# Patient Record
Sex: Male | Born: 1965 | Race: Black or African American | Hispanic: No | Marital: Married | Smoking: Never smoker
Health system: Southern US, Community
[De-identification: ages and names within clinical notes are randomized; demographics above are authoritative.]

## PROBLEM LIST (undated history)

## (undated) DIAGNOSIS — Z992 Dependence on renal dialysis: Secondary | ICD-10-CM

## (undated) DIAGNOSIS — I10 Essential (primary) hypertension: Secondary | ICD-10-CM

## (undated) HISTORY — PX: HERNIA REPAIR: SHX51

## (undated) HISTORY — PX: AV FISTULA PLACEMENT: SHX1204

## (undated) HISTORY — PX: KIDNEY TRANSPLANT: SHX239

---

## 2010-12-20 ENCOUNTER — Ambulatory Visit
Admission: RE | Admit: 2010-12-20 | Discharge: 2010-12-20 | Disposition: A | Discharge status: Home / Self Care | Payer: Medicare Other | Source: Ambulatory Visit | Attending: Geriatric Medicine | Admitting: Geriatric Medicine

## 2010-12-20 ENCOUNTER — Other Ambulatory Visit: Payer: Self-pay | Admitting: Geriatric Medicine

## 2010-12-20 DIAGNOSIS — M79673 Pain in unspecified foot: Secondary | ICD-10-CM

## 2012-08-29 IMAGING — CR DG FOOT COMPLETE 3+V*R*
3 series · 3 of 3 positions shown · non-contrast
Comparison: None.

CLINICAL DATA: Right foot pain.

RIGHT FOOT COMPLETE - 3+ VIEW

[view not recorded (1 of 3)]
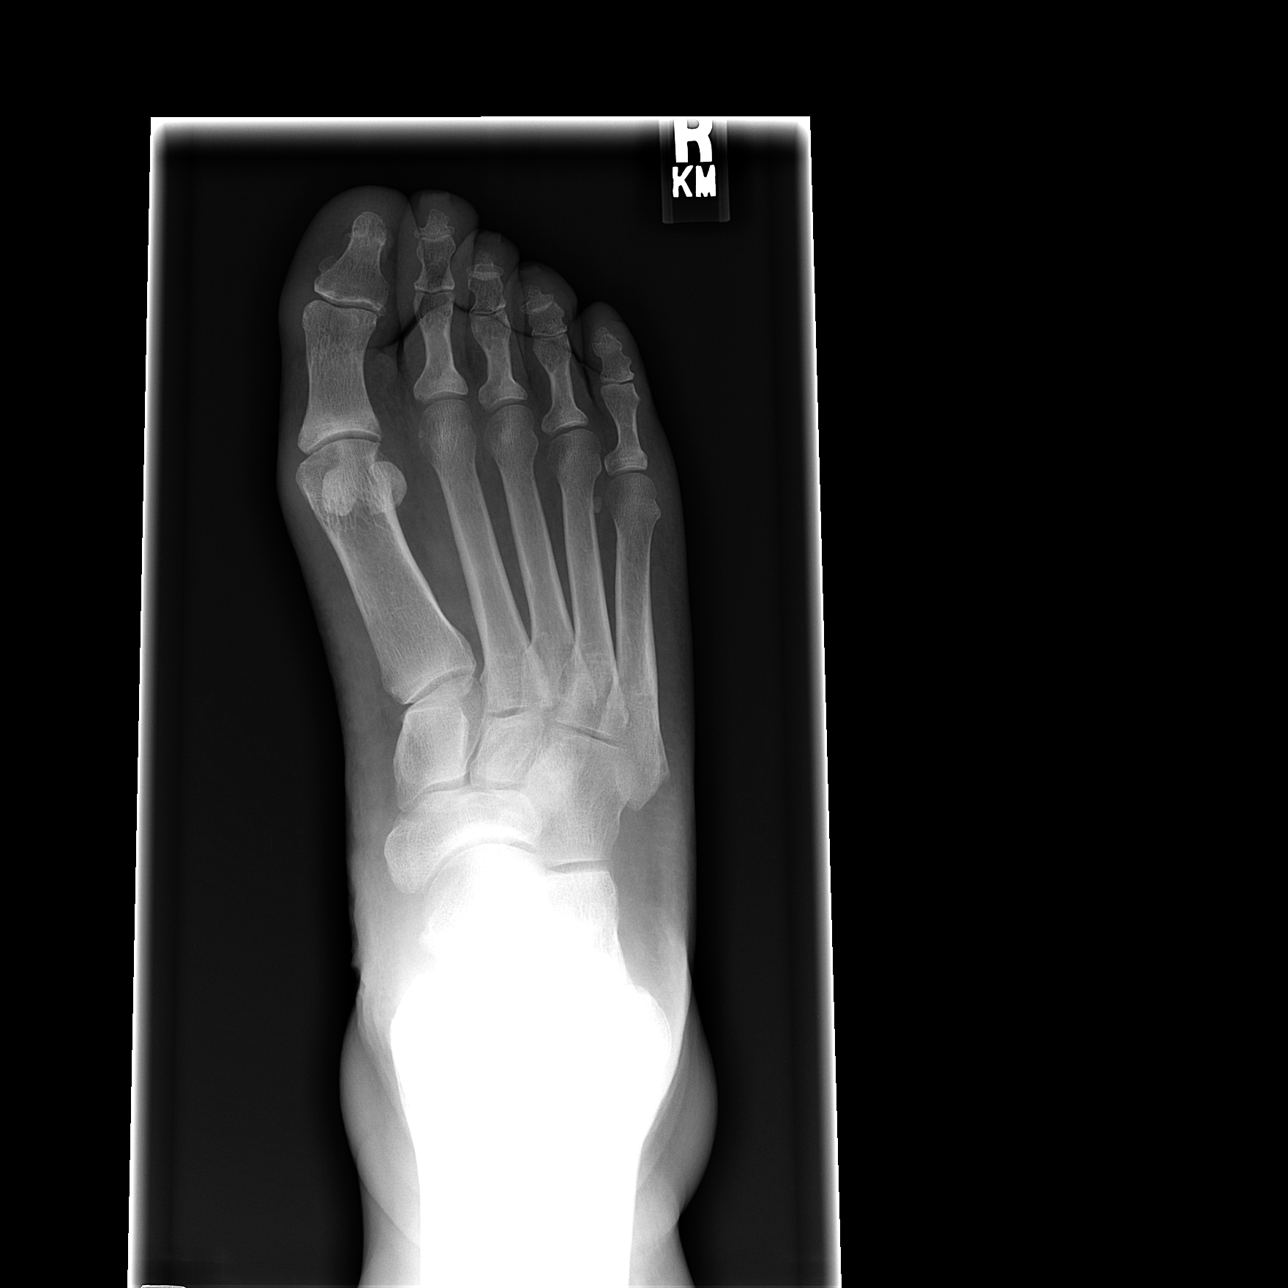

[view not recorded (2 of 3)]
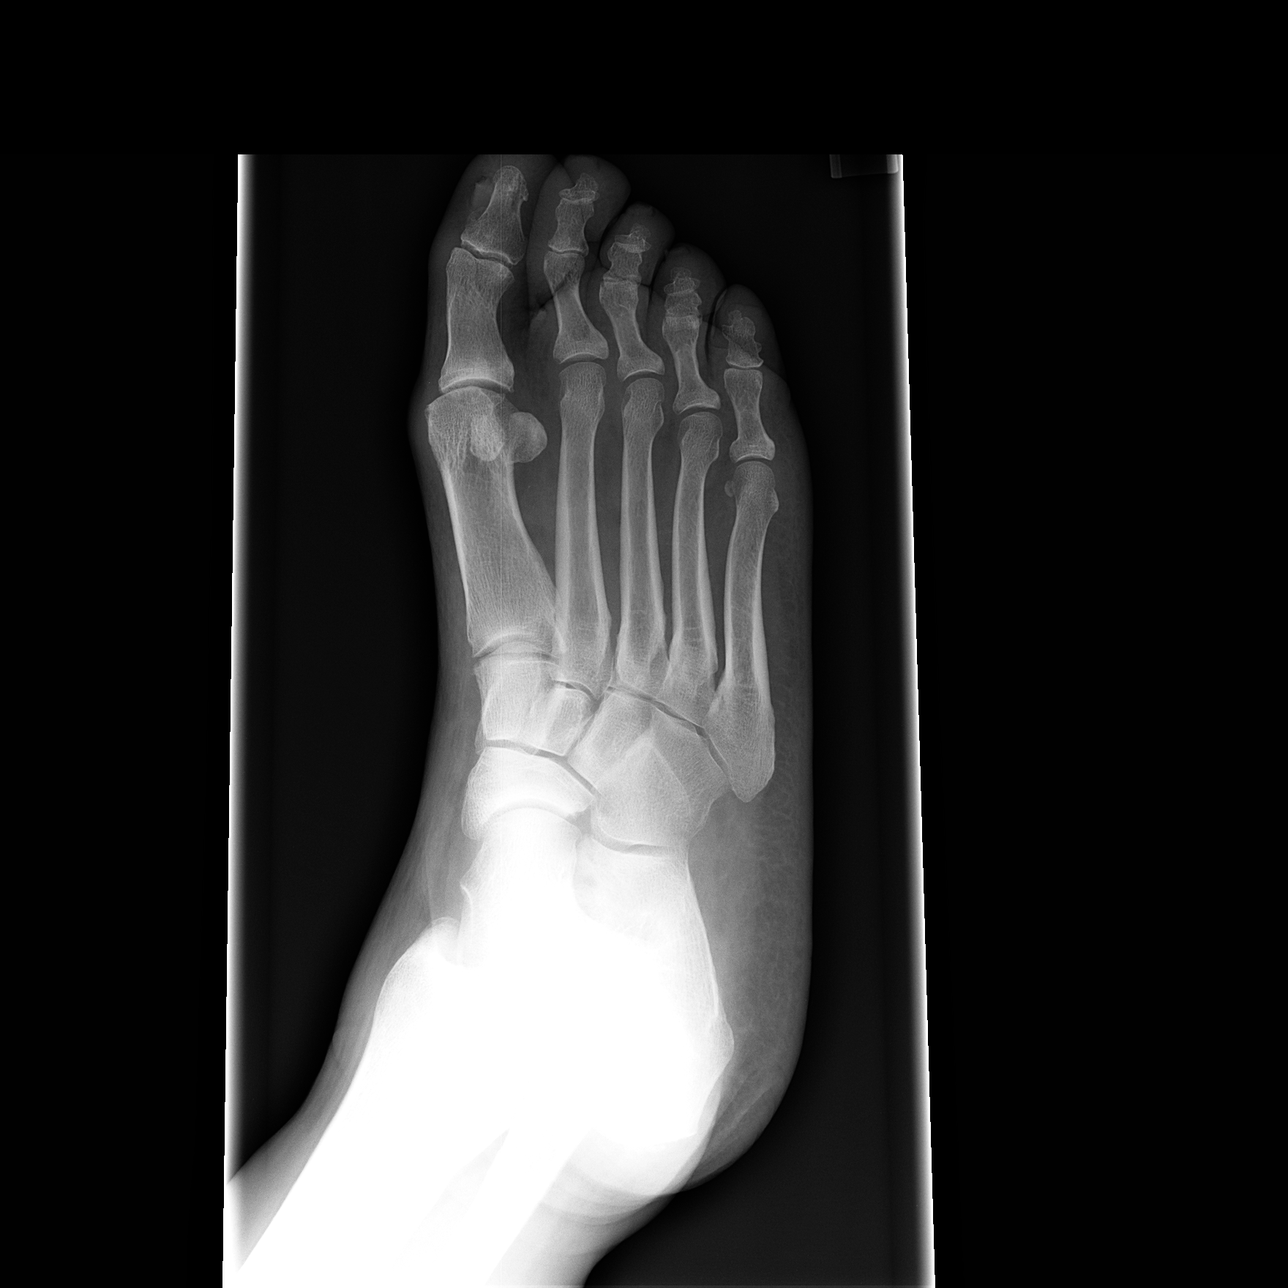

[view not recorded (3 of 3)]
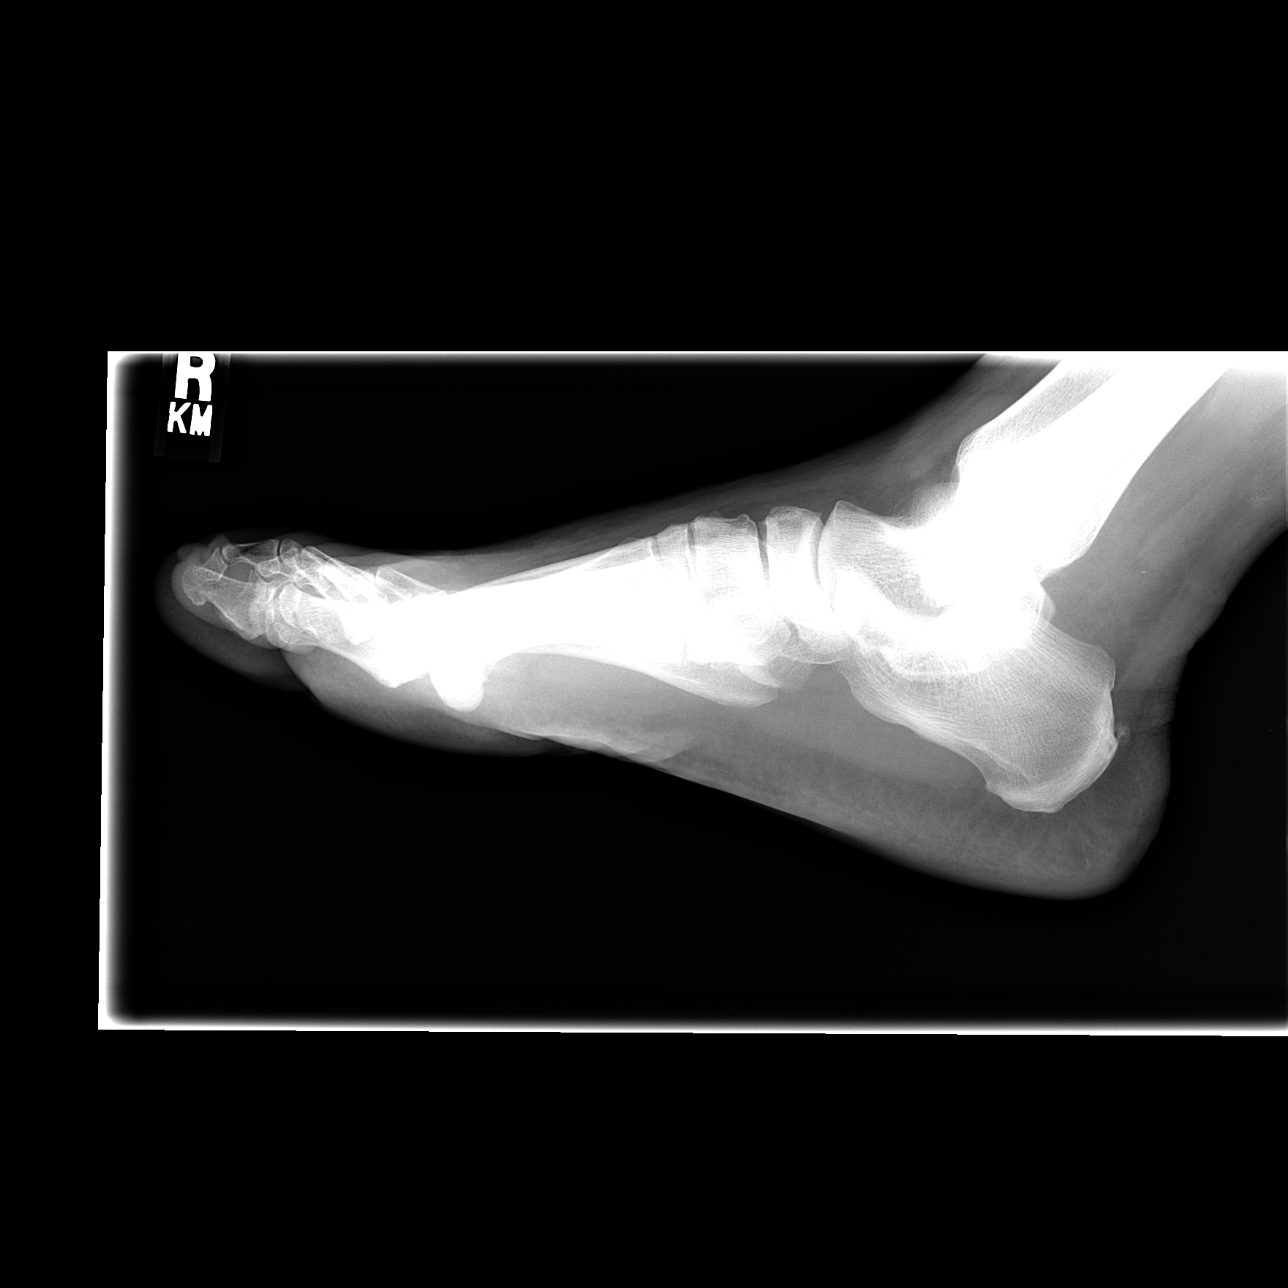

[3 of 3 positions shown; findings below may reference images not displayed]

FINDINGS: The joint spaces are fairly well maintained.  There are
mild degenerative changes at the first metatarsal phalangeal joint
and interphalangeal joint.  Mild hallux valgus deformity.  No acute
bony findings.  Minimal spurring change near the Achilles
attachment.
IMPRESSION: 1.  Mild degenerative changes involving the metatarsal phalangeal
joint and interphalangeal joint of the great toe.
2.  No acute bony findings.

## 2015-03-19 ENCOUNTER — Emergency Department (HOSPITAL_BASED_OUTPATIENT_CLINIC_OR_DEPARTMENT_OTHER)
Admission: EM | Admit: 2015-03-19 | Discharge: 2015-03-19 | Disposition: A | Discharge status: Home / Self Care | Payer: Medicare HMO | Attending: Emergency Medicine | Admitting: Emergency Medicine

## 2015-03-19 ENCOUNTER — Encounter (HOSPITAL_BASED_OUTPATIENT_CLINIC_OR_DEPARTMENT_OTHER): Payer: Self-pay | Admitting: *Deleted

## 2015-03-19 DIAGNOSIS — Z79899 Other long term (current) drug therapy: Secondary | ICD-10-CM | POA: Insufficient documentation

## 2015-03-19 DIAGNOSIS — N186 End stage renal disease: Secondary | ICD-10-CM | POA: Insufficient documentation

## 2015-03-19 DIAGNOSIS — Z992 Dependence on renal dialysis: Secondary | ICD-10-CM | POA: Diagnosis not present

## 2015-03-19 DIAGNOSIS — R319 Hematuria, unspecified: Secondary | ICD-10-CM | POA: Diagnosis present

## 2015-03-19 DIAGNOSIS — I120 Hypertensive chronic kidney disease with stage 5 chronic kidney disease or end stage renal disease: Secondary | ICD-10-CM | POA: Diagnosis not present

## 2015-03-19 DIAGNOSIS — N3091 Cystitis, unspecified with hematuria: Secondary | ICD-10-CM | POA: Insufficient documentation

## 2015-03-19 DIAGNOSIS — Z7901 Long term (current) use of anticoagulants: Secondary | ICD-10-CM | POA: Diagnosis not present

## 2015-03-19 DIAGNOSIS — N3001 Acute cystitis with hematuria: Secondary | ICD-10-CM

## 2015-03-19 HISTORY — DX: Dependence on renal dialysis: Z99.2

## 2015-03-19 HISTORY — DX: Essential (primary) hypertension: I10

## 2015-03-19 LAB — URINE MICROSCOPIC-ADD ON

## 2015-03-19 LAB — URINALYSIS, ROUTINE W REFLEX MICROSCOPIC
BILIRUBIN URINE: NEGATIVE
GLUCOSE, UA: NEGATIVE mg/dL
KETONES UR: NEGATIVE mg/dL
Nitrite: POSITIVE — AB
Specific Gravity, Urine: 1.015 (ref 1.005–1.030)
pH: 6.5 (ref 5.0–8.0)

## 2015-03-19 LAB — PROTIME-INR
INR: 2.29 — AB (ref 0.00–1.49)
PROTHROMBIN TIME: 25 s — AB (ref 11.6–15.2)

## 2015-03-19 MED ORDER — CEPHALEXIN 500 MG PO CAPS: 500.0000 mg | ORAL_CAPSULE | Freq: Two times a day (BID) | ORAL | Status: DC

## 2015-03-19 MED ORDER — CEPHALEXIN 250 MG PO CAPS
500.0000 mg | ORAL_CAPSULE | Freq: Once | ORAL | Status: AC
  Administered 2015-03-19: 500 mg via ORAL
  Filled 2015-03-19: qty 2

## 2015-03-19 NOTE — ED Notes (Signed)
Pt provided specimen cup for UA, states unable to provide spec at this time

## 2015-03-19 NOTE — ED Notes (Signed)
Pt reports hematuria since Friday- Pt states he had dialysis yesterday- denies pain

## 2015-03-19 NOTE — Discharge Instructions (Signed)
Please read and follow all provided instructions.  Your diagnoses today include:  1. Acute cystitis with hematuria    Tests performed today include:  Urine test - suggests that you have an infection in your bladder  Coumadin level - 2.5 today  Vital signs. See below for your results today.   Medications prescribed:   Keflex (cephalexin) - antibiotic  You have been prescribed an antibiotic medicine: take the entire course of medicine even if you are feeling better. Stopping early can cause the antibiotic not to work.  Home care instructions:  Follow any educational materials contained in this packet.  Follow-up instructions: Please follow-up with your nephrologist at dialysis in 2 days. Tell them that you're on medication for urinary tract infection.   Return instructions:   Please return to the Emergency Department if you experience worsening symptoms.   Return with fever, worsening pain, persistent vomiting, worsening pain in your back.   Please return if you have any other emergent concerns.  Additional Information:  Your vital signs today were: BP 112/70 mmHg   Pulse 78   Temp(Src) 98.1 F (36.7 C) (Oral)   Resp 18   Ht 5\' 11"  (1.803 m)   Wt 130.545 kg   BMI 40.16 kg/m2   SpO2 97% If your blood pressure (BP) was elevated above 135/85 this visit, please have this repeated by your doctor within one month. --------------

## 2015-03-19 NOTE — ED Provider Notes (Signed)
CSN: [id number]     Arrival date & time 03/19/15  1825 History   First MD Initiated Contact with Patient 03/19/15 2027     Chief Complaint  Patient presents with  . Hematuria     (Consider location/radiation/quality/duration/timing/severity/associated sxs/prior Treatment) HPI Comments: Patient with history of end-stage renal disease on dialysis Tuesday/Thursday/Saturday presents with two-day history of hematuria. 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 Patient's INR was checked several days ago and was in the 7 range. Patient was instructed to decrease dose of Coumadin. He has not had it rechecked since that time. Denies other areas of bleeding or bruising. Patient has had some increased urgency but no dysuria. He urinates approximately twice a day. No fever or back pain. No nausea or vomiting. Onset of symptoms acute. Course is constant. Nothing makes symptoms better or worse. He does have a remote history of UTI.  The history is provided by the patient.    Past Medical History  Diagnosis Date  . Dialysis patient (HCC)   . Hypertension    Past Surgical History  Procedure Laterality Date  . Av fistula placement    . Hernia repair     No family history on file. Social History  Substance Use Topics  . Smoking status: Never Smoker   . Smokeless tobacco: Never Used  . Alcohol Use: No     Comment: former    Review of Systems  Constitutional: Negative for fever.  HENT: Negative for rhinorrhea and sore throat.   Eyes: Negative for redness.  Respiratory: Negative for cough.   Cardiovascular: Negative for chest pain.  Gastrointestinal: Negative for nausea, vomiting, abdominal pain and diarrhea.  Genitourinary: Positive for urgency, frequency and hematuria. Negative for dysuria.  Musculoskeletal: Negative for myalgias.  Skin: Negative for rash.  Neurological: Negative for headaches.      Allergies  Tetracyclines & related  Home Medications   Prior  to Admission medications   Medication Sig Start Date End Date Taking? Authorizing Provider  amLODipine (NORVASC) 10 MG tablet Take 10 mg by mouth daily.   Yes Historical Provider, MD  Calcium Acetate, Phos Binder, (CALCIUM ACETATE PO) Take 3 tablets by mouth 3 (three) times daily with meals. 2 tabs with snacks   Yes Historical Provider, MD  carvedilol (COREG) 25 MG tablet Take 25 mg by mouth daily.   Yes Historical Provider, MD  cinacalcet (SENSIPAR) 60 MG tablet Take 60 mg by mouth daily.   Yes Historical Provider, MD  furosemide (LASIX) 40 MG tablet Take 40 mg by mouth daily.   Yes Historical Provider, MD  isosorbide mononitrate (IMDUR) 60 MG 24 hr tablet Take 60 mg by mouth daily.   Yes Historical Provider, MD  multivitamin (RENA-VIT) TABS tablet Take 1 tablet by mouth daily.   Yes Historical Provider, MD  oxycodone (OXY-IR) 5 MG capsule Take 5 mg by mouth every 4 (four) hours as needed.   Yes Historical Provider, MD  sevelamer carbonate (RENVELA) 800 MG tablet Take 800 mg by mouth 3 (three) times daily with meals. 3 tabs with meals, 2 tabs with snacks   Yes Historical Provider, MD  warfarin (COUMADIN) 7.5 MG tablet Take 7.5 mg by mouth daily.   Yes Historical Provider, MD   BP 131/72 mmHg  Pulse 86  Temp(Src) 98.1 F (36.7 C) (Oral)  Resp 18  Ht 5\' 11"  (1.803 m)  Wt 130.545 kg  BMI 40.16 kg/m2  SpO2 99% Physical Exam  Constitutional: He appears well-developed  and well-nourished.  HENT:  Head: Normocephalic and atraumatic.  Eyes: Conjunctivae are normal. Right eye exhibits no discharge. Left eye exhibits no discharge.  Neck: Normal range of motion. Neck supple.  Cardiovascular: Normal rate, regular rhythm and normal heart sounds.   Pulmonary/Chest: Effort normal and breath sounds normal.  Abdominal: Soft. There is no tenderness.  Neurological: He is alert.  Skin: Skin is warm and dry.  Psychiatric: He has a normal mood and affect.  Nursing note and vitals reviewed.   ED Course   Procedures (including critical care time) Labs Review Labs Reviewed  URINALYSIS, ROUTINE W REFLEX MICROSCOPIC (NOT AT [facility]) - Abnormal; Notable for the following:    Color, Urine AMBER (*)    APPearance CLOUDY (*)    Hgb urine dipstick LARGE (*)    Protein, ur >300 (*)    Nitrite POSITIVE (*)    Leukocytes, UA MODERATE (*)    All other components within normal limits  PROTIME-INR - Abnormal; Notable for the following:    Prothrombin Time 25.0 (*)    INR 2.29 (*)    All other components within normal limits  URINE MICROSCOPIC-ADD ON - Abnormal; Notable for the following:    Squamous Epithelial / LPF 0-5 (*)    Bacteria, UA MANY (*)    All other components within normal limits  URINE CULTURE    Imaging Review No results found. I have personally reviewed and evaluated these images and lab results as part of my medical decision-making.   EKG Interpretation None       8:37 PM Patient seen and examined. Work-up initiated.   Vital signs reviewed and are as follows: BP 131/72 mmHg  Pulse 86  Temp(Src) 98.1 F (36.7 C) (Oral)  Resp 18  Ht  (1.803 m)  Wt 130.545 kg  BMI 40.16 kg/m2  SpO2 99%  UA demonstrates infection with blood noted. INR is 2.29. Urine culture sent. Will start on Keflex 500 twice a day. Patient given instructions to inform his nephrologist at dialysis on Tuesday as to this new medication. Told to return to the emergency department with fever, back pain, vomiting which would indicate kidney infection. Patient verbalizes understanding and is in agreement with this plan. First dose of antibiotic given prior to discharge.  MDM   Final diagnoses:  Acute cystitis with hematuria   Patient with hematuria and increased frequency and urgency. He is ESRD on dialysis. He does have some residual kidney function. UA demonstrates infection. INR checked and found to be within normal range today. Started on antibiotic treatment. No signs and symptoms consistent with  pyelonephritis. Patient appears well, nontoxic. He will have close follow-up at dialysis in 2 days.   Renne Crigler, PA-C 03/20/15 [id number]  Rolan Bucco, MD 03/22/15 [phone]

## 2015-03-21 LAB — URINE CULTURE

## 2016-06-01 ENCOUNTER — Emergency Department (HOSPITAL_BASED_OUTPATIENT_CLINIC_OR_DEPARTMENT_OTHER)
Admission: EM | Admit: 2016-06-01 | Discharge: 2016-06-01 | Disposition: A | Discharge status: Home / Self Care | Payer: Medicare HMO | Attending: Emergency Medicine | Admitting: Emergency Medicine

## 2016-06-01 DIAGNOSIS — Z79899 Other long term (current) drug therapy: Secondary | ICD-10-CM | POA: Diagnosis not present

## 2016-06-01 DIAGNOSIS — R509 Fever, unspecified: Secondary | ICD-10-CM | POA: Diagnosis present

## 2016-06-01 DIAGNOSIS — J111 Influenza due to unidentified influenza virus with other respiratory manifestations: Secondary | ICD-10-CM | POA: Diagnosis not present

## 2016-06-01 DIAGNOSIS — I10 Essential (primary) hypertension: Secondary | ICD-10-CM | POA: Diagnosis not present

## 2016-06-01 DIAGNOSIS — Z7982 Long term (current) use of aspirin: Secondary | ICD-10-CM | POA: Diagnosis not present

## 2016-06-01 MED ORDER — OSELTAMIVIR PHOSPHATE 30 MG PO CAPS: ORAL_CAPSULE | ORAL | 0 refills | Status: AC

## 2016-06-01 MED ORDER — ACETAMINOPHEN 325 MG PO TABS
650.0000 mg | ORAL_TABLET | Freq: Once | ORAL | Status: AC
  Administered 2016-06-01: 650 mg via ORAL
  Filled 2016-06-01: qty 2

## 2016-06-01 NOTE — ED Provider Notes (Signed)
[facility] Provider Note: Lowella DellJ. Lane Jewelianna Pancoast, MD, FACEP  CSN: [id number] MRN: [id number] ARRIVAL: 06/01/16 at 2040 ROOM: [facility]  By signing my name below, I, Soijett Blue, attest that this documentation has been prepared under the direction and in the presence of Paula LibraJohn Anival Pasha, MD. Electronically Signed: Soijett Blue, ED Scribe. 06/01/16. 11:04 PM.  CHIEF COMPLAINT  Fever   HISTORY OF PRESENT ILLNESS  Roger Coleman is a 51 y.o. male with end-stage renal disease on hemodialysis complaining of fever to 101 onset today. Pt reports associated sweats, chills, nausea, generalized body aches and mild cough. He was noted to have a fever of 100.7 on arrival and was given acetaminophen 650 milligrams with improvement in his symptoms. Pt reports that he did obtain his flu vaccination this past year. He was last dialyzed this morning. He denies SOB or productive cough.    Past Medical History:  Diagnosis Date  . Dialysis patient (HCC)   . Hypertension     Past Surgical History:  Procedure Laterality Date  . AV FISTULA PLACEMENT    . HERNIA REPAIR      No family history on file.  Social History  Substance Use Topics  . Smoking status: Never Smoker  . Smokeless tobacco: Never Used  . Alcohol use No     Comment: former    Prior to Admission medications   Medication Sig Start Date End Date Taking? Authorizing Provider  aspirin 81 MG chewable tablet Chew by mouth daily.   Yes Historical Provider, MD  cephALEXin (KEFLEX) 500 MG capsule Take 500 mg by mouth 4 (four) times daily.   Yes Historical Provider, MD  Calcium Acetate, Phos Binder, (CALCIUM ACETATE PO) Take 3 tablets by mouth 3 (three) times daily with meals. 2 tabs with snacks    Historical Provider, MD  carvedilol (COREG) 25 MG tablet Take 25 mg by mouth daily.    Historical Provider, MD  cinacalcet (SENSIPAR) 60 MG tablet Take 60 mg by mouth daily.    Historical Provider, MD  furosemide (LASIX) 40 MG tablet Take 40  mg by mouth daily.    Historical Provider, MD  multivitamin (RENA-VIT) TABS tablet Take 1 tablet by mouth daily.    Historical Provider, MD  oseltamivir (TAMIFLU) 30 MG capsule Take one capsule now then one capsule after each of your next two dialyses. 06/01/16   Rawson Minix, MD  oxycodone (OXY-IR) 5 MG capsule Take 5 mg by mouth every 4 (four) hours as needed.    Historical Provider, MD  sevelamer carbonate (RENVELA) 800 MG tablet Take 800 mg by mouth 3 (three) times daily with meals. 3 tabs with meals, 2 tabs with snacks    Historical Provider, MD    Allergies Tetracyclines & related   REVIEW OF SYSTEMS  Negative except as noted here or in the History of Present Illness.   PHYSICAL EXAMINATION  Initial Vital Signs Blood pressure 118/76, pulse 112, temperature 100.7 F (38.2 C), temperature source Oral, resp. rate 18, height 5\' 11"  (1.803 m), weight 284 lb (128.8 kg), SpO2 98 %.  Examination General: Well-developed, well-nourished male in no acute distress; appearance consistent with age of record HENT: normocephalic; atraumatic Eyes: pupils equal, round and reactive to light; extraocular muscles intact Neck: supple Heart: regular rate and rhythm Lungs: clear to auscultation bilaterally Abdomen: soft; nondistended; nontender; no masses or hepatosplenomegaly; bowel sounds present Extremities: Dialysis fistula to right forearm with pulse and thrill; no deformity; full range of motion Neurologic: Awake, alert and  oriented; motor function intact in all extremities and symmetric; no facial droop Skin: Warm and dry Psychiatric: Normal mood and affect   RESULTS  Summary of this visit's results, reviewed by myself:   EKG Interpretation  Date/Time:    Ventricular Rate:    PR Interval:    QRS Duration:   QT Interval:    QTC Calculation:   R Axis:     Text Interpretation:        Laboratory Studies: No results found for this or any previous visit (from the past 24  hour(s)). Imaging Studies: No results found.  ED COURSE  Nursing notes and initial vitals signs, including pulse oximetry, reviewed.  Vitals:   06/01/16 2046 06/01/16 2047  BP:  118/76  Pulse:  112  Resp:  18  Temp:  100.7 F (38.2 C)  TempSrc:  Oral  SpO2:  98%  Weight: 284 lb (128.8 kg)   Height: 5\' 11"  (1.803 m)    We'll treat with hemodialysis-dosed Tamiflu.  PROCEDURES    ED DIAGNOSES     ICD-9-CM ICD-10-CM   1. Influenza 487.1 J11.1    I personally performed the services described in this documentation, which was scribed in my presence. The recorded information has been reviewed and is accurate.     Paula Libra, MD 06/01/16 [phone]

## 2016-06-01 NOTE — ED Triage Notes (Signed)
Pt reports fever, cough, chills x 1 day.

## 2020-08-02 ENCOUNTER — Other Ambulatory Visit: Payer: Self-pay

## 2020-08-02 ENCOUNTER — Ambulatory Visit: Payer: Medicare Other | Attending: Physician Assistant | Admitting: Physician Assistant

## 2021-05-30 ENCOUNTER — Encounter (HOSPITAL_BASED_OUTPATIENT_CLINIC_OR_DEPARTMENT_OTHER): Payer: Self-pay | Admitting: Emergency Medicine

## 2021-05-30 ENCOUNTER — Emergency Department (HOSPITAL_BASED_OUTPATIENT_CLINIC_OR_DEPARTMENT_OTHER)
Admission: EM | Admit: 2021-05-30 | Discharge: 2021-05-30 | Disposition: A | Discharge status: Home / Self Care | Payer: Medicare HMO | Attending: Emergency Medicine | Admitting: Emergency Medicine

## 2021-05-30 ENCOUNTER — Other Ambulatory Visit: Payer: Self-pay

## 2021-05-30 ENCOUNTER — Emergency Department (HOSPITAL_BASED_OUTPATIENT_CLINIC_OR_DEPARTMENT_OTHER): Payer: Medicare HMO

## 2021-05-30 DIAGNOSIS — Z20822 Contact with and (suspected) exposure to covid-19: Secondary | ICD-10-CM | POA: Diagnosis not present

## 2021-05-30 DIAGNOSIS — Z7982 Long term (current) use of aspirin: Secondary | ICD-10-CM | POA: Diagnosis not present

## 2021-05-30 DIAGNOSIS — I509 Heart failure, unspecified: Secondary | ICD-10-CM

## 2021-05-30 DIAGNOSIS — D649 Anemia, unspecified: Secondary | ICD-10-CM

## 2021-05-30 DIAGNOSIS — M21611 Bunion of right foot: Secondary | ICD-10-CM | POA: Diagnosis not present

## 2021-05-30 DIAGNOSIS — Z79899 Other long term (current) drug therapy: Secondary | ICD-10-CM | POA: Diagnosis not present

## 2021-05-30 DIAGNOSIS — R059 Cough, unspecified: Secondary | ICD-10-CM | POA: Diagnosis present

## 2021-05-30 DIAGNOSIS — M21619 Bunion of unspecified foot: Secondary | ICD-10-CM

## 2021-05-30 DIAGNOSIS — R0789 Other chest pain: Secondary | ICD-10-CM | POA: Insufficient documentation

## 2021-05-30 DIAGNOSIS — N289 Disorder of kidney and ureter, unspecified: Secondary | ICD-10-CM | POA: Diagnosis not present

## 2021-05-30 LAB — CBC WITH DIFFERENTIAL/PLATELET
Abs Immature Granulocytes: 0.05 10*3/uL (ref 0.00–0.07)
Basophils Absolute: 0 10*3/uL (ref 0.0–0.1)
Basophils Relative: 0 %
Eosinophils Absolute: 0.1 10*3/uL (ref 0.0–0.5)
Eosinophils Relative: 1 %
HCT: 29.7 % — ABNORMAL LOW (ref 39.0–52.0)
Hemoglobin: 8.8 g/dL — ABNORMAL LOW (ref 13.0–17.0)
Immature Granulocytes: 1 %
Lymphocytes Relative: 9 %
Lymphs Abs: 0.9 10*3/uL (ref 0.7–4.0)
MCH: 23.9 pg — ABNORMAL LOW (ref 26.0–34.0)
MCHC: 29.6 g/dL — ABNORMAL LOW (ref 30.0–36.0)
MCV: 80.7 fL (ref 80.0–100.0)
Monocytes Absolute: 0.9 10*3/uL (ref 0.1–1.0)
Monocytes Relative: 9 %
Neutro Abs: 8.1 10*3/uL — ABNORMAL HIGH (ref 1.7–7.7)
Neutrophils Relative %: 80 %
Platelets: 270 10*3/uL (ref 150–400)
RBC: 3.68 MIL/uL — ABNORMAL LOW (ref 4.22–5.81)
RDW: 15.9 % — ABNORMAL HIGH (ref 11.5–15.5)
WBC: 10 10*3/uL (ref 4.0–10.5)
nRBC: 0 % (ref 0.0–0.2)

## 2021-05-30 LAB — TROPONIN I (HIGH SENSITIVITY)
Troponin I (High Sensitivity): 23 ng/L — ABNORMAL HIGH (ref <18)
Troponin I (High Sensitivity): 24 ng/L — ABNORMAL HIGH (ref <18)

## 2021-05-30 LAB — BASIC METABOLIC PANEL
Anion gap: 10 (ref 5–15)
BUN: 43 mg/dL — ABNORMAL HIGH (ref 6–20)
CO2: 18 mmol/L — ABNORMAL LOW (ref 22–32)
Calcium: 8.4 mg/dL — ABNORMAL LOW (ref 8.9–10.3)
Chloride: 110 mmol/L (ref 98–111)
Creatinine, Ser: 3.43 mg/dL — ABNORMAL HIGH (ref 0.61–1.24)
GFR, Estimated: 20 mL/min — ABNORMAL LOW (ref >60)
Glucose, Bld: 127 mg/dL — ABNORMAL HIGH (ref 70–99)
Potassium: 4.3 mmol/L (ref 3.5–5.1)
Sodium: 138 mmol/L (ref 135–145)

## 2021-05-30 LAB — BRAIN NATRIURETIC PEPTIDE: B Natriuretic Peptide: 269.4 pg/mL — ABNORMAL HIGH (ref 0.0–100.0)

## 2021-05-30 LAB — RESP PANEL BY RT-PCR (FLU A&B, COVID) ARPGX2
Influenza A by PCR: NEGATIVE
Influenza B by PCR: NEGATIVE
SARS Coronavirus 2 by RT PCR: NEGATIVE

## 2021-05-30 MED ORDER — ACETAMINOPHEN 500 MG PO TABS
1000.0000 mg | ORAL_TABLET | Freq: Once | ORAL | Status: AC
  Administered 2021-05-30: 1000 mg via ORAL
  Filled 2021-05-30: qty 2

## 2021-05-30 MED ORDER — FUROSEMIDE 10 MG/ML IJ SOLN
40.0000 mg | Freq: Once | INTRAMUSCULAR | Status: AC
  Administered 2021-05-30: 40 mg via INTRAVENOUS
  Filled 2021-05-30: qty 4

## 2021-05-30 NOTE — ED Provider Notes (Addendum)
[facility] EMERGENCY DEPARTMENT Provider Note   CSN: [id number] Arrival date & time: 05/30/21  1941     History  Chief Complaint  Patient presents with   Foot Pain    cough    Roger Coleman is a 56 y.o. male.  The history is provided by the patient.  Cough Cough characteristics:  Non-productive Severity:  Moderate Onset quality:  Gradual Duration:  2 weeks Timing:  Intermittent Progression:  Unchanged Smoker: no   Context: not exposure to allergens, not weather changes and not with activity   Relieved by:  Nothing Worsened by:  Nothing Ineffective treatments:  None tried Associated symptoms: no chest pain, no ear fullness, no fever, no rash, no shortness of breath, no sore throat and no weight loss   Associated symptoms comment:  Chest tightness, not pain.  Also has R great toe and bunion pain.  Colchicine is not helping  Risk factors: no chemical exposure   Patient status post renal transplant at [facility] who saw clinic yesterday and had labs showing worsening renal function presents with cough and 2 weeks of chest tightness.  He did not tell them in clinic he was having toe pain or cough or tightness. No pain per se.  No neck pain,  No leg swelling.  No pain with inspiration.  Called his PMD who told him to come to the ED for evaluation.      Home Medications Prior to Admission medications   Medication Sig Start Date End Date Taking? Authorizing Provider  aspirin 81 MG chewable tablet Chew by mouth daily.    [provider]  Calcium Acetate, Phos Binder, (CALCIUM ACETATE PO) Take 3 tablets by mouth 3 (three) times daily with meals. 2 tabs with snacks    [provider]  carvedilol (COREG) 25 MG tablet Take 25 mg by mouth daily.    [provider]  cephALEXin (KEFLEX) 500 MG capsule Take 500 mg by mouth 4 (four) times daily.    [provider]  cinacalcet (SENSIPAR) 60 MG tablet Take 60 mg by mouth daily.    [provider]  furosemide (LASIX) 40 MG tablet Take 40 mg by mouth daily.    [provider]  multivitamin (RENA-VIT) TABS tablet Take 1 tablet by mouth daily.    [provider]  oseltamivir (TAMIFLU) 30 MG capsule Take one capsule now then one capsule after each of your next two dialyses. 06/01/16   Molpus, John, MD  oxycodone (OXY-IR) 5 MG capsule Take 5 mg by mouth every 4 (four) hours as needed.    [provider]  sevelamer carbonate (RENVELA) 800 MG tablet Take 800 mg by mouth 3 (three) times daily with meals. 3 tabs with meals, 2 tabs with snacks    [provider]      Allergies    Tetracyclines & related    Review of Systems   Review of Systems  Constitutional:  Negative for fever and weight loss.  HENT:  Negative for sore throat.   Eyes:  Negative for redness.  Respiratory:  Positive for cough and chest tightness. Negative for shortness of breath.   Cardiovascular:  Negative for chest pain.  Gastrointestinal:  Negative for abdominal pain.  Genitourinary:  Negative for difficulty urinating.  Musculoskeletal:  Positive for arthralgias.  Skin:  Negative for rash.  Neurological:  Negative for facial asymmetry.  Psychiatric/Behavioral:  Negative for agitation.   All other systems reviewed and are negative.  Physical Exam  Updated Vital Signs BP (!) 145/79 (BP Location: Left Arm)    Pulse (!) 103    Temp 99.3 F (37.4 C) (Oral)    Resp (!) 30    Ht 5\' 11"  (1.803 m)    Wt 114.8 kg    SpO2 94%    BMI 35.29 kg/m  Physical Exam Vitals and nursing note reviewed.  Constitutional:      Appearance: Normal appearance. He is not diaphoretic.  HENT:     Head: Normocephalic and atraumatic.     Nose: Nose normal.  Eyes:     Conjunctiva/sclera: Conjunctivae normal.     Pupils: Pupils are equal, round, and reactive to light.  Cardiovascular:     Rate and Rhythm: Normal rate and regular rhythm.     Pulses: Normal pulses.     Heart sounds: Normal heart sounds.   Pulmonary:     Effort: Pulmonary effort is normal.     Breath sounds: Rales present.  Chest:     Chest wall: No tenderness.  Abdominal:     General: Abdomen is flat. Bowel sounds are normal.     Palpations: Abdomen is soft.     Tenderness: There is no abdominal tenderness. There is no guarding.  Musculoskeletal:        General: Normal range of motion.     Cervical back: Normal range of motion and neck supple.     Right lower leg: No edema.     Left lower leg: No edema.     Right ankle: Normal.     Left ankle: Normal.     Right foot: Normal capillary refill. Bunion present. No bony tenderness or crepitus.     Comments: Bunion is painful.  No warmth no erythema, of the fist MTP  Skin:    General: Skin is warm and dry.     Capillary Refill: Capillary refill takes less than 2 seconds.  Neurological:     General: No focal deficit present.     Mental Status: He is alert and oriented to person, place, and time.     Deep Tendon Reflexes: Reflexes normal.  Psychiatric:        Mood and Affect: Mood normal.        Behavior: Behavior normal.    ED Results / Procedures / Treatments   Labs (all labs ordered are listed, but only abnormal results are displayed) Results for orders placed or performed during the hospital encounter of 05/30/21  Resp Panel by RT-PCR (Flu A&B, Covid) Nasopharyngeal Swab   Specimen: Nasopharyngeal Swab; Nasopharyngeal(NP) swabs in vial transport medium  Result Value Ref Range   SARS Coronavirus 2 by RT PCR NEGATIVE NEGATIVE   Influenza A by PCR NEGATIVE NEGATIVE   Influenza B by PCR NEGATIVE NEGATIVE  CBC with Differential/Platelet  Result Value Ref Range   WBC 10.0 4.0 - 10.5 K/uL   RBC 3.68 (L) 4.22 - 5.81 MIL/uL   Hemoglobin 8.8 (L) 13.0 - 17.0 g/dL   HCT 16.129.7 (L) 09.639.0 - 04.552.0 %   MCV 80.7 80.0 - 100.0 fL   MCH 23.9 (L) 26.0 - 34.0 pg   MCHC 29.6 (L) 30.0 - 36.0 g/dL   RDW 40.915.9 (H) 81.111.5 - 91.415.5 %   Platelets 270 150 - 400 K/uL   nRBC 0.0 0.0 - 0.2 %    Neutrophils Relative % 80 %   Neutro Abs 8.1 (H) 1.7 - 7.7 K/uL   Lymphocytes Relative 9 %   Lymphs Abs 0.9  0.7 - 4.0 K/uL   Monocytes Relative 9 %   Monocytes Absolute 0.9 0.1 - 1.0 K/uL   Eosinophils Relative 1 %   Eosinophils Absolute 0.1 0.0 - 0.5 K/uL   Basophils Relative 0 %   Basophils Absolute 0.0 0.0 - 0.1 K/uL   Immature Granulocytes 1 %   Abs Immature Granulocytes 0.05 0.00 - 0.07 K/uL  Basic metabolic panel  Result Value Ref Range   Sodium 138 135 - 145 mmol/L   Potassium 4.3 3.5 - 5.1 mmol/L   Chloride 110 98 - 111 mmol/L   CO2 18 (L) 22 - 32 mmol/L   Glucose, Bld 127 (H) 70 - 99 mg/dL   BUN 43 (H) 6 - 20 mg/dL   Creatinine, Ser 4.15 (H) 0.61 - 1.24 mg/dL   Calcium 8.4 (L) 8.9 - 10.3 mg/dL   GFR, Estimated 20 (L) >60 mL/min   Anion gap 10 5 - 15  Brain natriuretic peptide  Result Value Ref Range   B Natriuretic Peptide 269.4 (H) 0.0 - 100.0 pg/mL  Troponin I (High Sensitivity)  Result Value Ref Range   Troponin I (High Sensitivity) 23 (H) <18 ng/L   DG Chest Portable 1 View  Result Date: 05/30/2021 CLINICAL DATA:  Cough EXAM: PORTABLE CHEST 1 VIEW COMPARISON:  12/15/2018 FINDINGS: The lungs are symmetrically well expanded. No pneumothorax or pleural effusion. Cardiac size is within normal limits. Central pulmonary arteries are enlarged. There is superimposed trace perihilar interstitial pulmonary infiltrate most in keeping with perihilar pulmonary edema. IMPRESSION: Central pulmonary arterial enlargement in keeping with changes of pulmonary arterial hypertension. Trace superimposed perihilar pulmonary edema. Electronically Signed   By: Helyn Numbers M.D.   On: 05/30/2021 04:35    EKG EKG Interpretation  Date/Time:  Wednesday May 30 2021 04:26:19 EST Ventricular Rate:  103 PR Interval:  137 QRS Duration: 74 QT Interval:  338 QTC Calculation: 443 R Axis:   43 Text Interpretation: Sinus tachycardia Premature ventricular complexes Confirmed by Kentaro Alewine,  Chistian Kasler ([id number]) on 05/30/2021 4:53:43 AM  Radiology DG Chest Portable 1 View  Result Date: 05/30/2021 CLINICAL DATA:  Cough EXAM: PORTABLE CHEST 1 VIEW COMPARISON:  12/15/2018 FINDINGS: The lungs are symmetrically well expanded. No pneumothorax or pleural effusion. Cardiac size is within normal limits. Central pulmonary arteries are enlarged. There is superimposed trace perihilar interstitial pulmonary infiltrate most in keeping with perihilar pulmonary edema. IMPRESSION: Central pulmonary arterial enlargement in keeping with changes of pulmonary arterial hypertension. Trace superimposed perihilar pulmonary edema. Electronically Signed   By: Helyn Numbers M.D.   On: 05/30/2021 04:35    Procedures Procedures    Medications Ordered in ED Medications  acetaminophen (TYLENOL) tablet 1,000 mg (1,000 mg Oral Given 05/30/21 0603)  furosemide (LASIX) injection 40 mg (40 mg Intravenous Given 05/30/21 0768)    ED Course/ Medical Decision Making/ A&P                           Medical Decision Making Problems Addressed: Anemia, unspecified type: chronic illness or injury    Details: likely secondary to renal failure but worsening Cough, unspecified type:    Details: likely secondary to volume overload which is secondary to worsening renal failure Kidney disease:    Details: s/p transplant with worsening function.  I have discussed this with transplant at [facility]  Amount and/or Complexity of Data Reviewed External Data Reviewed: labs and notes.    Details: transplant notes and labs at [facility] reviewed.  Renal insufficiency.  Worsening over last several months Labs: ordered.    Details: anemia, 8.8 worsening, slight elevation of troponin, likely secondary to renal failure and CHF. Radiology: ordered.    Details: CHF on CXR, I agree with radiology Discussion of management or test interpretation with external provider(s): Case d/w Dr. Mortimer Fries at [facility].  All labs reviewed via phone including troponin of  23.  This does not appear to be emergent and he does not believe the patient needs admission.  Give IV lasix and have patient come to clinic today.    Risk OTC drugs. Prescription drug management. Decision regarding hospitalization. Risk Details: I called [facility] regarding worsening anemia, worsening renal failure and cough that is likely secondary to volume overload.  They do not want to hospitalize the patient at this time.  They want a dose of IV lasix and to have patient come to clinic this am.  I do not believe this is a PE, pain is not pleuritic, there is no calf pain or tenderness.  No travel.  Troponins are just above the top limit of normal but flat and this is almost certainly due to renal failure and CHF.  Per discussion with Dr. Mortimer Fries this does not need to be hospitalized and may be given IV lasix and come to clinic this am.  Patient has been given IV lasix and has been instructed to go to [facility] to transplant clinic to be seen for fluid overload, worsening renal function, and cough.      Final Clinical Impression(s) / ED Diagnoses Final diagnoses:  Anemia, unspecified type  Kidney disease  Cough, unspecified type   Return for intractable cough, coughing up blood, fevers > 100.4 unrelieved by medication, shortness of breath, intractable vomiting, chest pain, shortness of breath, weakness, numbness, changes in speech, facial asymmetry, abdominal pain, passing out, Inability to tolerate liquids or food, cough, altered mental status or any concerns. No signs of systemic illness or infection. The patient is nontoxic-appearing on exam and vital signs are within normal limits.  I have reviewed the triage vital signs and the nursing notes. Pertinent labs & imaging results that were available during my care of the patient were reviewed by me and considered in my medical decision making (see chart for details). After history, exam, and medical workup I feel the patient has been appropriately  medically screened and is safe for discharge home. Pertinent diagnoses were discussed with the patient. Patient was given return precautions.     Simrat Kendrick, MD 05/30/21 [id number]

## 2021-05-30 NOTE — ED Triage Notes (Signed)
Pt is c/o dry cough for about 2 weeks  Pt states he had a negative covid test the end of Jan.  Pt states he has pain and swelling to the big joint in his right foot  Pt states the dr gave him gout medicine and it had started to improve but is now getting worse again

## 2021-05-30 NOTE — ED Notes (Signed)
Dc instructions reviewed with pt no questions or concerns at this time. Will go to [facility] at this time per provider dc instructions.

## 2021-05-30 NOTE — Discharge Instructions (Signed)
Go straight [facility] to transplant clinic per discussion with Dr. Mortimer Fries.

## 2023-02-07 IMAGING — DX DG CHEST 1V PORT
1 series · 1 of 1 positions shown · non-contrast
Comparison: 12/15/2018

CLINICAL DATA: Cough

EXAM:
PORTABLE CHEST 1 VIEW

[chest ap]
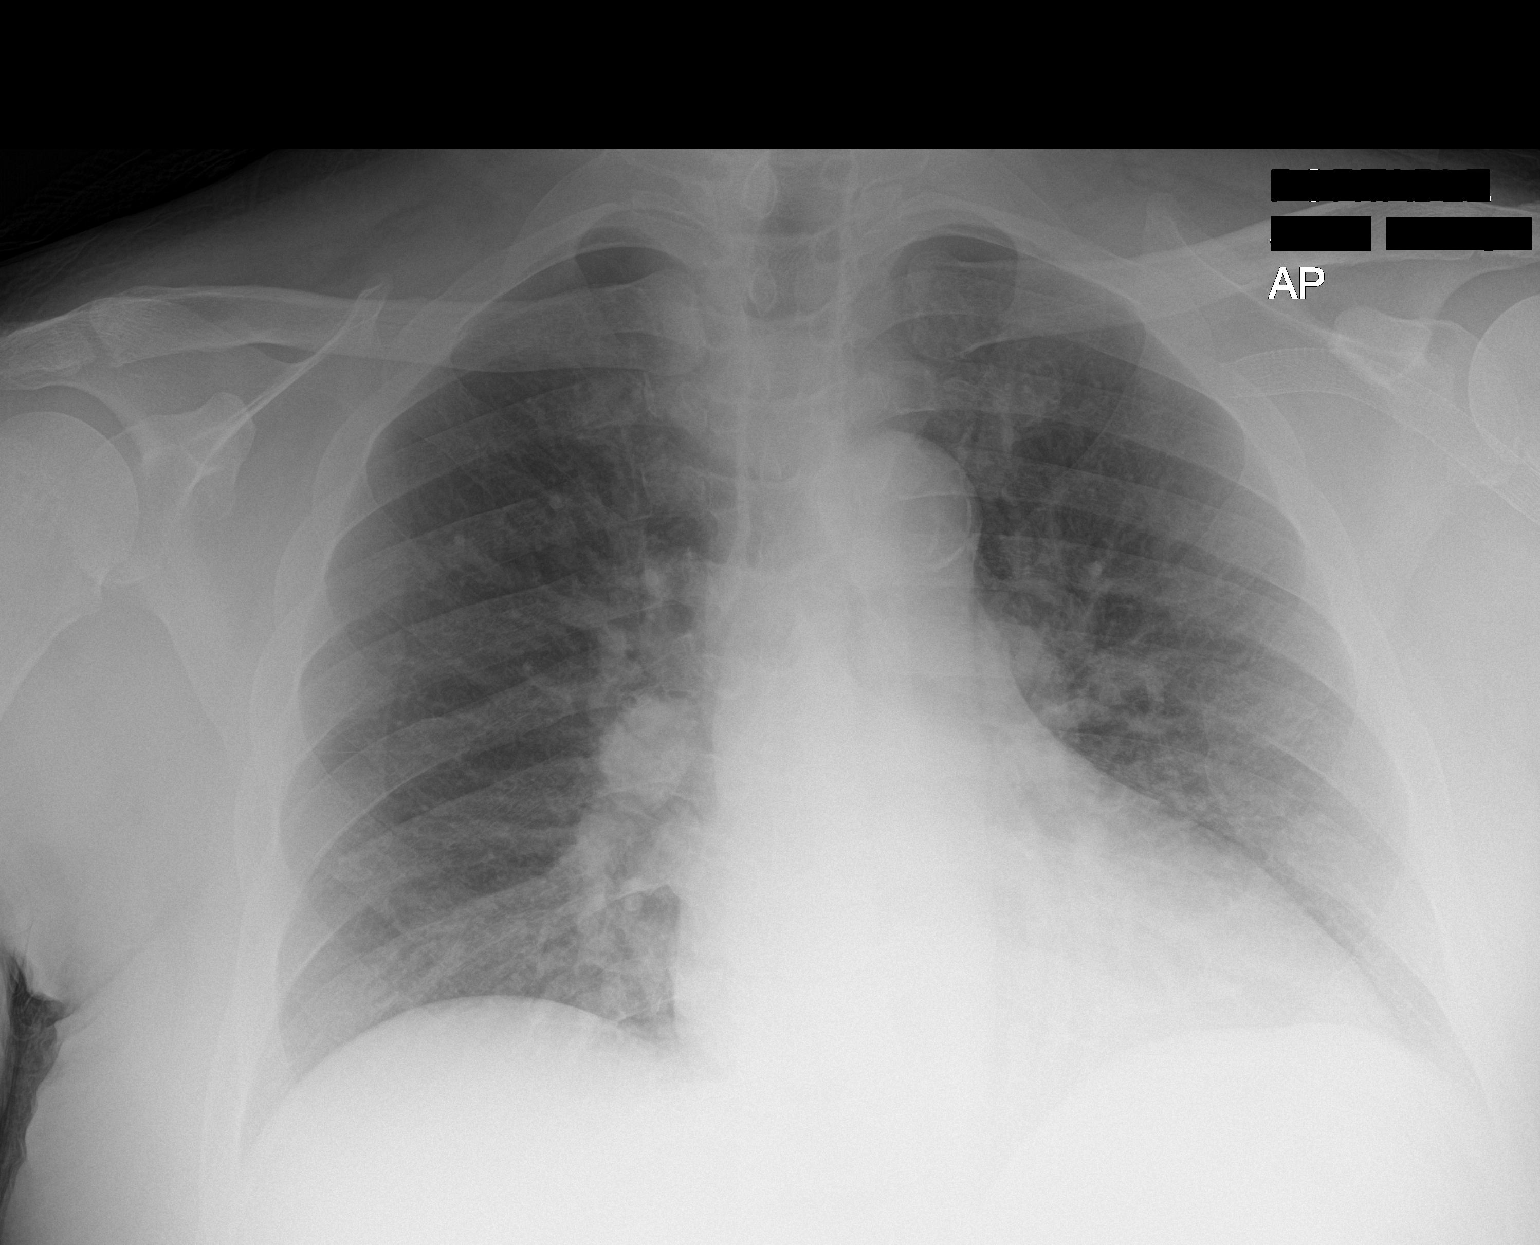

[1 of 1 positions shown; findings below may reference images not displayed]

FINDINGS: The lungs are symmetrically well expanded. No pneumothorax or
pleural effusion. Cardiac size is within normal limits. Central
pulmonary arteries are enlarged. There is superimposed trace
perihilar interstitial pulmonary infiltrate most in keeping with
perihilar pulmonary edema.
IMPRESSION: Central pulmonary arterial enlargement in keeping with changes of
pulmonary arterial hypertension. Trace superimposed perihilar
pulmonary edema.

## 2023-06-24 ENCOUNTER — Emergency Department (HOSPITAL_BASED_OUTPATIENT_CLINIC_OR_DEPARTMENT_OTHER)
Admission: EM | Admit: 2023-06-24 | Discharge: 2023-06-25 | Disposition: A | Discharge status: Home / Self Care | Attending: Emergency Medicine | Admitting: Emergency Medicine

## 2023-06-24 ENCOUNTER — Other Ambulatory Visit: Payer: Self-pay

## 2023-06-24 ENCOUNTER — Encounter (HOSPITAL_BASED_OUTPATIENT_CLINIC_OR_DEPARTMENT_OTHER): Payer: Self-pay | Admitting: Emergency Medicine

## 2023-06-24 ENCOUNTER — Emergency Department (HOSPITAL_BASED_OUTPATIENT_CLINIC_OR_DEPARTMENT_OTHER)

## 2023-06-24 DIAGNOSIS — Z7982 Long term (current) use of aspirin: Secondary | ICD-10-CM | POA: Diagnosis not present

## 2023-06-24 DIAGNOSIS — R059 Cough, unspecified: Secondary | ICD-10-CM | POA: Insufficient documentation

## 2023-06-24 DIAGNOSIS — M7989 Other specified soft tissue disorders: Secondary | ICD-10-CM | POA: Insufficient documentation

## 2023-06-24 DIAGNOSIS — I10 Essential (primary) hypertension: Secondary | ICD-10-CM | POA: Insufficient documentation

## 2023-06-24 DIAGNOSIS — R0602 Shortness of breath: Secondary | ICD-10-CM | POA: Insufficient documentation

## 2023-06-24 DIAGNOSIS — R0981 Nasal congestion: Secondary | ICD-10-CM | POA: Insufficient documentation

## 2023-06-24 DIAGNOSIS — Z79899 Other long term (current) drug therapy: Secondary | ICD-10-CM | POA: Diagnosis not present

## 2023-06-24 LAB — CBC WITH DIFFERENTIAL/PLATELET
Abs Immature Granulocytes: 0.03 10*3/uL (ref 0.00–0.07)
Basophils Absolute: 0 10*3/uL (ref 0.0–0.1)
Basophils Relative: 1 %
Eosinophils Absolute: 0.1 10*3/uL (ref 0.0–0.5)
Eosinophils Relative: 1 %
HCT: 43.3 % (ref 39.0–52.0)
Hemoglobin: 12.5 g/dL — ABNORMAL LOW (ref 13.0–17.0)
Immature Granulocytes: 1 %
Lymphocytes Relative: 17 %
Lymphs Abs: 1.1 10*3/uL (ref 0.7–4.0)
MCH: 24 pg — ABNORMAL LOW (ref 26.0–34.0)
MCHC: 28.9 g/dL — ABNORMAL LOW (ref 30.0–36.0)
MCV: 83.1 fL (ref 80.0–100.0)
Monocytes Absolute: 0.6 10*3/uL (ref 0.1–1.0)
Monocytes Relative: 9 %
Neutro Abs: 4.5 10*3/uL (ref 1.7–7.7)
Neutrophils Relative %: 71 %
Platelets: 212 10*3/uL (ref 150–400)
RBC: 5.21 MIL/uL (ref 4.22–5.81)
RDW: 16.9 % — ABNORMAL HIGH (ref 11.5–15.5)
WBC: 6.3 10*3/uL (ref 4.0–10.5)
nRBC: 0 % (ref 0.0–0.2)

## 2023-06-24 LAB — COMPREHENSIVE METABOLIC PANEL
ALT: 15 U/L (ref 0–44)
AST: 20 U/L (ref 15–41)
Albumin: 3.4 g/dL — ABNORMAL LOW (ref 3.5–5.0)
Alkaline Phosphatase: 63 U/L (ref 38–126)
Anion gap: 9 (ref 5–15)
BUN: 23 mg/dL — ABNORMAL HIGH (ref 6–20)
CO2: 20 mmol/L — ABNORMAL LOW (ref 22–32)
Calcium: 8.9 mg/dL (ref 8.9–10.3)
Chloride: 110 mmol/L (ref 98–111)
Creatinine, Ser: 2.05 mg/dL — ABNORMAL HIGH (ref 0.61–1.24)
GFR, Estimated: 37 mL/min — ABNORMAL LOW (ref >60)
Glucose, Bld: 114 mg/dL — ABNORMAL HIGH (ref 70–99)
Potassium: 4.2 mmol/L (ref 3.5–5.1)
Sodium: 139 mmol/L (ref 135–145)
Total Bilirubin: 0.7 mg/dL (ref 0.0–1.2)
Total Protein: 7.2 g/dL (ref 6.5–8.1)

## 2023-06-24 LAB — RESP PANEL BY RT-PCR (RSV, FLU A&B, COVID)  RVPGX2
Influenza A by PCR: NEGATIVE
Influenza B by PCR: NEGATIVE
Resp Syncytial Virus by PCR: NEGATIVE
SARS Coronavirus 2 by RT PCR: NEGATIVE

## 2023-06-24 LAB — TROPONIN I (HIGH SENSITIVITY)
Troponin I (High Sensitivity): 34 ng/L — ABNORMAL HIGH (ref <18)
Troponin I (High Sensitivity): 35 ng/L — ABNORMAL HIGH (ref <18)

## 2023-06-24 LAB — D-DIMER, QUANTITATIVE: D-Dimer, Quant: <0.27 ug{FEU}/mL (ref 0.00–0.50)

## 2023-06-24 MED ORDER — ALBUTEROL SULFATE HFA 108 (90 BASE) MCG/ACT IN AERS: 2.0000 | INHALATION_SPRAY | RESPIRATORY_TRACT | Status: DC | PRN

## 2023-06-24 NOTE — ED Triage Notes (Signed)
 Pt POV steady gait- c/o Shob x2-3 weeks.  Denies known sick contacts, denies fever.   Hx of kidney transplant in 2021.

## 2023-06-24 NOTE — ED Provider Notes (Signed)
 [place] EMERGENCY DEPARTMENT AT [facility] Provider Note   CSN: [id number] Arrival date & time: 06/24/23  1443     History  Chief Complaint  Patient presents with   Shortness of Breath   HPI Roger Coleman is a 58 y.o. male with h/o kidney transplant in 2021 and hypertension presented for shortness of breath.  States has been going on for 2 to 3 weeks.  It is worth exertion but not lying flat.  Denies chest pain.  States he did start to have a cough and some nasal congestion couple days ago but denies fever.  States he has some swelling in his lower extremities around his ankles but states this is not really new as he has had intermittent swelling since his transplant.   Shortness of Breath      Home Medications Prior to Admission medications   Medication Sig Start Date End Date Taking? Authorizing Provider  aspirin 81 MG chewable tablet Chew by mouth daily.    [provider]  Calcium Acetate, Phos Binder, (CALCIUM ACETATE PO) Take 3 tablets by mouth 3 (three) times daily with meals. 2 tabs with snacks    [provider]  carvedilol (COREG) 25 MG tablet Take 25 mg by mouth daily.    [provider]  cephALEXin (KEFLEX) 500 MG capsule Take 500 mg by mouth 4 (four) times daily.    [provider]  cinacalcet (SENSIPAR) 60 MG tablet Take 60 mg by mouth daily.    [provider]  furosemide (LASIX) 40 MG tablet Take 40 mg by mouth daily.    [provider]  multivitamin (RENA-VIT) TABS tablet Take 1 tablet by mouth daily.    [provider]  oseltamivir (TAMIFLU) 30 MG capsule Take one capsule now then one capsule after each of your next two dialyses. 06/01/16   Molpus, Deandra Gadson, MD  oxycodone (OXY-IR) 5 MG capsule Take 5 mg by mouth every 4 (four) hours as needed.    [provider]  sevelamer carbonate (RENVELA) 800 MG tablet Take 800 mg by mouth 3 (three) times daily with meals. 3 tabs with meals, 2 tabs  with snacks    [provider]      Allergies    Tetracyclines & related    Review of Systems   Review of Systems  Respiratory:  Positive for shortness of breath.     Physical Exam   Vitals:   06/24/23 1930 06/24/23 1937  BP: (!) 176/79   Pulse: 64 91  Resp: 14 20  Temp:    SpO2: 100% 98%    CONSTITUTIONAL:  well-appearing, NAD NEURO:  Alert and oriented x 3, CN 3-12 grossly intact EYES:  eyes equal and reactive ENT/NECK:  Supple, no stridor  CARDIO:  regular rate and rhythm, appears well-perfused  PULM:  No respiratory distress, CTAB GI/GU:  non-distended, soft MSK/SPINE:  No gross deformities, no edema, moves all extremities  SKIN:  no rash, atraumatic  *Additional and/or pertinent findings included in MDM below  ED Results / Procedures / Treatments   Labs (all labs ordered are listed, but only abnormal results are displayed) Labs Reviewed  COMPREHENSIVE METABOLIC PANEL - Abnormal; Notable for the following components:      Result Value   CO2 20 (*)    Glucose, Bld 114 (*)    BUN 23 (*)    Creatinine, Ser 2.05 (*)    Albumin 3.4 (*)    GFR, Estimated 37 (*)  All other components within normal limits  CBC WITH DIFFERENTIAL/PLATELET - Abnormal; Notable for the following components:   Hemoglobin 12.5 (*)    MCH 24.0 (*)    MCHC 28.9 (*)    RDW 16.9 (*)    All other components within normal limits  TROPONIN I (HIGH SENSITIVITY) - Abnormal; Notable for the following components:   Troponin I (High Sensitivity) 34 (*)    All other components within normal limits  TROPONIN I (HIGH SENSITIVITY) - Abnormal; Notable for the following components:   Troponin I (High Sensitivity) 35 (*)    All other components within normal limits  RESP PANEL BY RT-PCR (RSV, FLU A&B, COVID)  [id number]  D-DIMER, QUANTITATIVE    EKG EKG Interpretation Date/Time:  Tuesday June 24 2023 15:04:00 EST Ventricular Rate:  74 PR Interval:  146 QRS Duration:  84 QT  Interval:  384 QTC Calculation: 426 R Axis:   -25  Text Interpretation: Sinus rhythm with Premature atrial complexes Minimal voltage criteria for LVH, may be normal variant ( R in aVL ) Anterior infarct , age undetermined Abnormal ECG When compared with ECG of 30-May-2021 04:26, PREVIOUS ECG IS PRESENT Confirmed by Edwin Dada ([id number]) on 06/24/2023 8:09:44 PM  Radiology DG Chest 2 View Result Date: 06/24/2023 CLINICAL DATA:  Shortness of breath for 3 weeks. EXAM: CHEST - 2 VIEW COMPARISON:  05/30/2021 FINDINGS: The heart size and mediastinal contours are within normal limits. Both lungs are clear. The visualized skeletal structures are unremarkable. IMPRESSION: No active cardiopulmonary disease. Electronically Signed   By: Danae Orleans M.D.   On: 06/24/2023 17:28    Procedures Procedures    Medications Ordered in ED Medications  albuterol (VENTOLIN HFA) 108 (90 Base) MCG/ACT inhaler 2 puff (has no administration in time range)    ED Course/ Medical Decision Making/ A&P Clinical Course as of 06/24/23 2350  Tue Jun 24, 2023  2200 DG Chest 2 View [JR]  2310 BMI (Calculated): 42.14 [JR]    Clinical Course User Index [JR] Gareth Eagle, PA-C                                 Medical Decision Making Amount and/or Complexity of Data Reviewed Labs: ordered. Radiology: ordered. Decision-making details documented in ED Course.  Risk Prescription drug management.   Initial Impression and Ddx 58 year old well-appearing male presenting for shortness of breath.  Exam was unremarkable.  DDx includes PE, ACS, CHF exacerbation, pneumonia, pneumothorax, other. Patient PMH that increases complexity of ED encounter:  h/o kidney transplant in 2021 and hypertension  Interpretation of Diagnostics - I independent reviewed and interpreted the labs as followed: Elevated first troponin (34), 2nd trop (35)  - I independently visualized the following imaging with scope of interpretation limited to  determining acute life threatening conditions related to emergency care: cxr, which revealed no acute cardiopulmonary process  - I personally reviewed and interpreted EKG which revealed sinus rhythm, PACs,   Patient Reassessment and Ultimate Disposition/Management On reassessment, patient remained chest pain-free.  Ambulated the patient around the unit and he did very well, not hypoxic or tachypneic and denied shortness of breath that time.  Considered PE but unlikely given no chest pain, negative D-dimer and vitals are reassuring.  Also considered ACS but EKG was reassuring and troponins remain flat and patient remained without chest pain.  Advised him to follow-up with his PCP and his cardiologist.  Discussed return precautions.  Discharged.  Patient management required discussion with the following services or consulting groups:  None  Complexity of Problems Addressed Acute complicated illness or Injury  Additional Data Reviewed and Analyzed Further history obtained from: Past medical history and medications listed in the EMR and Prior ED visit notes  Patient Encounter Risk Assessment None         Final Clinical Impression(s) / ED Diagnoses Final diagnoses:  SOB (shortness of breath)    Rx / DC Orders ED Discharge Orders     None         Gareth Eagle, PA-C 06/24/23 2351    Franne Forts, DO 06/28/23 1729

## 2023-06-24 NOTE — Discharge Instructions (Addendum)
 Evaluation for shortness of breath is overall reassuring.  Recommend you follow-up with your PCP and your cardiologist.  If you develop worsening shortness of breath, lower extremity swelling, chest pain, cough and fever or any other concerning symptom please return emergency department for further evaluation.

## 2023-06-24 NOTE — ED Notes (Signed)
 Patient ambulated around the department.  He remained on room air throughout the trip and did not have any increase in work of breathing.  Vital signs at the end of ambulation are as charted below.    06/24/23 1937  Therapy Vitals  Pulse Rate 91  Resp 20  MEWS Score/Color  MEWS Score 0  MEWS Score Color Green  Oxygen Therapy/Pulse Ox  O2 Device Room Air  SpO2 98 %

## 2024-01-26 NOTE — Progress Notes (Signed)
 [facility]  - [facility] Medicare Wellness Visit Type:: Subsequent Annual Wellness Visit  Name: Roger Coleman. Date of Birth: [birth year] Age: 58 y.o. MRN: [id number] Visit Date: 01/26/2024  History obtained from: patient  Living Arrangements/Support System/Health Assessment/Pain/Stress Marital status: married Number of children: 5 Occupation: disabled Living arrangements: (!) lives alone Does the patient have a support system (family, friend, church, Conservation officer, nature, etc)?: Yes   Do you have any dental concerns?: No In the past month, have you experienced a change in your bladder control?: No   Do you have any difficulty obtaining your medications?: No   Do you have trouble consistently taking or remembering to take all of your medications as prescribed?: No Patient rates overall stress level as: None Does stress affect daily life?: No Typical amount of pain: some Does pain affect daily life?: (!) Yes Are you currently prescribed opioids?: No                Depression Screening  Behavioral Health Screening  Patient Health Questionnaire-2 Score: 3 (01/26/2024  7:26 AM)  Patient Health Questionnaire-9 Score: 9 (01/26/2024  7:26 AM)    Interpretation =   PHQ-9 Interpretation: Positive (Mild Depression Severity) (01/26/2024  7:26 AM)   PHQ Question # 9 Thoughts that you would be better off dead or hurting yourself in some way: Not at all (01/26/2024  7:26 AM)      Patient's Depression screening/score is Positive   Depression Plan: Situational stressors reviewed, patient to return to office if symptoms persist or worsen    Social History (Tobacco/Drugs/Sexual Activity) Roger Coleman reports that he has never smoked. He has never been exposed to tobacco smoke. He has never used smokeless tobacco. Tobacco Use?: No How many times in the past year have you used a recreational drug or used a prescription medication for nonmedical reasons?:  None Risk factors for sexually transmitted infections (i.e., multiple sexual partners): No Are you bothered by sexual problems?: No  Alcohol Screening How often do you have a drink containing alcohol?: Never How many standard drinks containing alcohol do you have on a typical day?: Never, 1 or 2 drinks How often do you have six or more drinks on one occasion?: Never Audit-C Score: 0  Physical Activity Regular exercise?: (!) No      Diet How many meals a day?: 2 Eats fruit and vegetables daily?: Yes Most meals are obtained by: preparing their own meals, eating out  Home and Transportation Safety All rugs have non-skid backing?: Yes All stairs or steps have railings?: Yes Grab bars in the bathtub or shower?: Yes Have non-skid surface in bathtub or shower?: Yes Good home lighting?: Yes Regular seat belt use?: Yes      Activities of Daily Living Feed self?: Yes Bathe self?: Yes Dress self?: Yes Use toilet without assistance?: Yes Walk without assistance?: Yes    Instrumental Activities of Daily Living Manage finances?: Yes Shop for themselves?: Yes Prepare meals?: Yes Use the telephone?: Yes Manage medications?: Yes   Performs basic housework/laundry?: Yes Drives?: Yes Primary transportation is: driving  Hearing Concerns about hearing?: No Uses hearing aids?: No Hear whispered voice? (Observed): Yes  Vision Concerns about vision?: No Vision exam performed?: (!) No  Fall Risk Is the patient ambulatory?: Yes One or more falls in the last year:: No Feels unsteady when walking:: No  Cognitive Assessment Has a diagnosis of dementia or cognitive impairment?: No Are there any memory concerns by the patient,  others, or providers?: No              Advance Directives Living will?: Yes Advance directive information provided to patient: Yes Healthcare POA?: (!) No       Who is your in case of emergency contact?: wife Relationship to patient: Spouse      Other History I reviewed and updated the following risk factors and conditions as appropriate: Reviewed/Updated: Problem List, Medical History, Surgical History, Family History, Medications, Allergies Reviewed/Updated: Vital Signs (height, weight, and BP), Immunizations, Health Maintenance Patient Care Team Updated: Done  Vital Signs BP 151/77 (BP Location: Left arm, Patient Position: Sitting)   Pulse 87   Temp 97.5 F (36.4 C) (Temporal)   Ht 1.803 m (5' 11)   Wt (!) 142 kg (312 lb 11.2 oz)   BMI 43.61 kg/m   Screening and Immunizations Health Maintenance Status       Date Due Completion Dates   Pneumococcal Vaccine for Ages 50+ (3 of 3 - PCV) 04/02/2022 04/02/2021, 02/20/2014   COVID-19 Vaccine (8 - Moderna risk 2024-25 season) 12/22/2023 01/15/2023, 06/09/2022   Influenza Vaccine (1) 11/21/2023 04/27/2022, 01/09/2021   Colorectal Cancer Screening 08/26/2024 11/12/2023, 08/27/2023   Diabetes Screening 01/22/2025 01/23/2024, 01/13/2024   Comprehensive Annual Visit 01/25/2025 01/26/2024, 01/16/2023   Depression Screening 01/25/2025 01/26/2024   DTaP/Tdap/Td Vaccines (2 - Td or Tdap) 12/19/2026 12/18/2016   Adult RSV (60+ Years or Pregnancy) (1 - 1-dose 75+ series) 05/23/2040 ---       Immunization History  Administered Date(s) Administered  . Influenza, High-dose Seasonal, Quadrivalent, Preservative Free 01/09/2021  . Influenza, Injectable, Quadrivalent, Preservative Free 02/25/2020, 04/27/2022  . Influenza, Unspecified 01/23/2017  . Influenza, split virus, trivalent, preservative 02/20/2014, 02/08/2015  . Moderna Covid-19, mRNA,LNP-S,PF 12+ Yrs 01/15/2023  . Moderna SARS-CoV-2 Primary Series 12+ yrs 10/07/2019, 11/04/2019, 12/05/2019  . Pfizer COVID-19 12+yrs (aka Comirnaty) 06/09/2022  . Pfizer SARS-CoV-2 Bivalent 12+ yrs 01/18/2021, 12/11/2021  . Pneumococcal Polysaccharide Vaccine, 23 Valent (PNEUMOVAX-23) 2Y+ 02/20/2014, 04/02/2021  . TDAP VACCINE (BOOSTRIX,ADACEL) 7Y+  12/18/2016  . Varicella Zoster [facility]) 18Y+ 02/03/2022, 04/10/2022    Assessment/Plan: Subsequent Annual Wellness Visit: The topics above were reviewed with the patient.  Healthy lifestyle principles reviewed.  Recommendations provided when indicated.  Follow up 1 year for next wellness visit.  Orders Placed This Encounter  Procedures  . Pneumococcal Conjugate Vaccine 20-Valent (PREVNAR-20) 6 wks+  . PSA, Total (Screen)  . Hemoglobin A1C With Estimated Average Glucose  . Lipid Panel  . Ambulatory referral to Urology    New Medications Ordered This Visit  Medications  . polyethylene glycol (GLYCOLAX) 17 gram packet    Sig: Take 17 g by mouth daily.    Dispense:  1530 g    Refill:  4    Take 17 g by mouth daily as needed.  . tamsulosin (FLOMAX) 0.4 mg cap    Sig: Take 1 capsule (0.4 mg total) by mouth daily.    Dispense:  90 capsule    Refill:  3    Patient Care Team: Alm Dudley, MD as PCP - General (Family Medicine) Alm Dudley, MD as PCP - Attributed  Electronically signed by: Alm Dudley, MD 01/26/2024 8:01 AM  --------------------------------------------------------------------------------------------------------------------------------------------------------   HPI   Roger Coleman. presents today for: Chief Complaint  Patient presents with  . Medicare Annual Wellness Visit Subsequent  . Annual Exam  . Follow-up   History of Present Illness The patient presents for evaluation of fatigue, cough, urinary frequency,  constipation, blood pressure management, erectile dysfunction, and health maintenance.  He reports persistent fatigue, which he attributes to his breathing difficulties. He is scheduled to see a hematologist/oncologist tomorrow for the placement of a Zio patch. He has a history of kidney transplant and is currently on immunosuppressive therapy. He is curious if his myeloma could be related to his kidney transplant, as it was not  detected in previous blood tests. He is not experiencing any depressive symptoms but acknowledges a lack of motivation due to his fatigue. He expresses a desire to resume walking and exercising but finds it challenging due to his breathing issues. He also mentions that he needs to rest after climbing stairs or walking long distances.  He continues to use an inhaler for his cough, which he describes as feeling like his lungs are closing up.  He has noticed an increase in urinary frequency, needing to urinate every hour, including during the night. He is concerned that this may be a symptom of an enlarged prostate. He is interested in trying Flomax (tamsulosin) 0.4 mg for his prostate issues and requests a 90-day supply. He is currently taking terazosin, which he believes helps control his blood pressure for about 12 hours.  He suspects that his medications may be causing constipation. He has not been using MiraLAX or magnesium recently but plans to resume them. He is still taking iron supplements and consumes coffee.  He does not regularly monitor his blood pressure but plans to start doing so. He is currently taking terazosin, which he believes helps control his blood pressure for about 12 hours.  He has tried over-the-counter Viagra without success and is interested in exploring other options. He is concerned about potential cardiac side effects from these medications.  He is due for another pneumonia booster and is considering getting the influenza vaccine and COVID-19 booster this season. He requests a note stating that he should not lift more than 15 pounds due to his back condition.  Occupation: Community education officer Drinks: Consumes coffee  PAST SURGICAL HISTORY: - Kidney transplant  Physical Exam   Vitals:   01/26/24 0729 01/26/24 0733  BP: (!) 190/93 151/77  BP Location: Left arm Left arm  Patient Position: Sitting Sitting  Pulse: 87 87  Temp: 97.5 F (36.4 C)    TempSrc: Temporal   Weight: (!) 142 kg (312 lb 11.2 oz)   Height: 1.803 m (5' 11)     General Appearance:    Alert, cooperative, no distress, appears stated age  Head:    Normocephalic, atraumatic  Eyes:    PERRL, conjunctiva/corneas clear, EOM's intact  Ears:    Normal TM's and external ear canals, both ears  Nose:   Nares normal, septum midline, mucosa normal, no drainage  Throat:   Lips, mucosa, and tongue normal; teeth and gums normal  Neck:   Supple, no adenopathy - thyroid normal  Lungs:     Clear to auscultation bilaterally  Heart:    Regular rate & rhythm - no murmurs  Abdomen:     Soft - non tender - no palpable liver edge  Extremities:   No edema  Pulses:   Normal pulses and good capillary refill  Skin:   No rashes/lesions observed  Lymph nodes:   No cervical or supraclavicular LAD  Neurologic: GU:   Cranial nerves intact - gait normal   Deferred exam   Assessment/Plan   Assessment & Plan Encounter for general adult medical examination with abnormal findings - He is  due for another pneumonia booster and is considering getting the influenza vaccine and COVID-19 booster this season. - A note has been provided stating that he should not lift more than 15 pounds due to his back condition. 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 - He will follow up with Hematology/Oncology for further evaluation, including the placement of a ZIO patch to monitor his heart. Orders: .  PSA, Total (Screen) .  Hemoglobin A1C With Estimated Average Glucose .  Lipid Panel  Essential (primary) hypertension - His blood pressure is mildly elevated today. - He is advised to monitor his blood pressure regularly and report any instances of low blood pressure.    Stage 4 chronic kidney disease    (CMD) - Monitoring - stable and follows with Renal/Transplant    Benign prostatic hyperplasia with urinary frequency - He  experiences frequent urination, including every hour at night, which may indicate an enlarged prostate. - A prescription for tamsulosin 0.4 mg has been provided to help him empty his bladder more efficiently. - He is advised to monitor his blood pressure while on this medication. Orders: .  PSA, Total (Screen)  Erectile dysfunction, unspecified erectile dysfunction type - He has not found over-the-counter Viagra effective. - A referral to Urology has been made for further evaluation and potential treatment options, including injections. Orders: .  Ambulatory referral to Urology; Future

## 2024-02-05 NOTE — Progress Notes (Signed)
Case Request

## 2024-02-05 NOTE — H&P (Signed)
 Urology History and Physical  Subjective: Patient is a 58 y.o. 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   They have seen Dr. Vannie in consultation and would like to proceed with transplant kidney ureteral stent exchange.   No new medical history, medicines, or recent illnesses noted.    Urine culture results reviewed. Patient has not taken pre-operative antibiotics. Discussed with attending physician urine culture results  Lab Results  Component Value Date/Time   URINECX No Growth 12/09/2022 04:03 PM   URINECX No Growth 11/06/2022 10:15 AM     Medical History[1]  Surgical History[2]  Prescriptions Prior to Admission[3] Allergies[4]     Family History[5]   Review of Systems Constitutional symptoms: Denies fevers, chills Eyes:  Denies changes in vision  Ear, nose, throat:  Denies sore throat, headaches Cardiovascular: Denies chest pain, palpitations Respiratory:  Denies SOB Gastrointestinal:  Denies nausea, vomiting diarrhea Genitourinary:  Denies hematuria Skin:  Denies skin changes Neurological:  Denies changes in sensation or motor function Musculoskeletal:  Denies joint pain issues Psychiatric:  Negative Endocrine:  Negative Hematological: Negative Allergic:  Negative  Objective: Vital signs in last 24 hours: Temp:  [97.4 F (36.3 C)] 97.4 F (36.3 C) Heart Rate:  [88] 88 Resp:  [16] 16 BP: (160)/(88) 160/88  General: Awake, in no apparent distress Chest: Equal chest rise bilaterally, no increased work of breathing Cardiac: Hemodynamically stable Extremities: extremities warm and without acute gross deformities Neurological: No gross deficits   Assessment: 58 y.o. male with history as above here for surgical management.  Plan:  The diagnoses, recommended procedures, and plan of care was once again discussed in detail with family. All risks, benefits, and alternatives of the procedure were  clearly elucidated. All questions were solicited and answered to their satisfaction.  Informed consent was obtained.  Patient and plan of care discussed with Hulda Margrette Vannie, * who is in agreement.   Ester Debby Burkes, MD         [1] Past Medical History: Diagnosis Date  . Anemia   . BP (high blood pressure)   . COVID-19   . 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 Gout   . Hemodialysis patient   . History of kidney cancer   . Hypertension with goal to be determined   . Kidney failure   . Kidney transplanted 05/19/2019  . OSA on CPAP   [2] Past Surgical History: Procedure Laterality Date  . AV FISTULA PLACEMENT Right 12/23/2014   Procedure: RIGHT  RADIO CEPHALIC  A-V FISTULA;  Surgeon: Zachary Roys Loria Raddle., MD;  Location[facility] MAIN OR;  Service: Vascular;  Laterality: Right;  . AV FISTULA PLACEMENT Right 01/25/2015   Procedure: A-V FISTULA;  Surgeon: Zachary Roys Loria Raddle., MD;  Location: [facility] MAIN OR;  Service: Vascular;  Laterality: Right;  . AV FISTULA REPAIR Left 04/22/2012   Procedure: A-V FISTULA SUPERFICIALIZATION  . AV FISTULA REPAIR Right 06/12/2018   Procedure: A-V FISTULA REVISION;  Surgeon: Donnice Belvie Fear, MD;  Location: [facility] MAIN OR;  Service: Vascular;  Laterality: Right;  . CARDIAC CATHETERIZATION  08/2017   Procedure: CARDIAC CATHETERIZATION  . COLONOSCOPY     Procedure: COLONOSCOPY  . CORONARY ANGIOPLASTY WITH STENT PLACEMENT  10/2022  . CYSTOSCOPY N/A 03/01/2020   Procedure: CYSTOSCOPY;  Surgeon: Montel Shirlyn Oris, MD;  Location: [facility] MAIN OR;  Service: Urology;  Laterality: N/A;  . CYSTOSCOPY  Right 04/03/2021   Procedure: CYSTOSCOPY;  Surgeon: Tanda Jama Nicholaus DOUGLAS, MD;  Location: [facility] OUTPATIENT OR;  Service: Urology;  Laterality: Right;  . CYSTOSCOPY N/A 10/24/2021   Procedure: CYSTOSCOPY / change of transplant stent;  Surgeon: Ranell Von Constable, MD;  Location: [facility] MAIN OR;  Service: Urology;   Laterality: N/A;  . CYSTOSCOPY W/ STENT CHANGE N/A 10/17/2020   Procedure: CYSTOSCOPY W/ STENT CHANGE;  Surgeon: Tanda Jama Nicholaus DOUGLAS, MD;  Location: [facility] OUTPATIENT OR;  Service: Urology;  Laterality: N/A;  TRANSPLANT URETERAL STENT  . CYSTOSCOPY W/ STENT CHANGE Right 01/16/2021   Procedure: CYSTOSCOPY W/ STENT CHANGE--TRANSPLANT KIDNEY;  Surgeon: Tanda Jama Nicholaus DOUGLAS, MD;  Location: [facility] OUTPATIENT OR;  Service: Urology;  Laterality: Right;  . CYSTOSCOPY W/ STENT CHANGE N/A 03/11/2022   Procedure: CYSTOSCOPY W/ DOUBLE J TRANSPLANT STENT CHANGE;  Surgeon: Constable Ranell Von, MD;  Location: [facility] MAIN OR;  Service: Urology;  Laterality: N/A;  . CYSTOSCOPY W/ STENT CHANGE N/A 06/04/2022   Procedure: CYSTOSCOPY W/ STENT CHANGE;  Surgeon: Nicholaus Tanda Jama DOUGLAS, MD;  Location: [facility] OUTPATIENT OR;  Service: Urology;  Laterality: N/A;  tranplant stent  . EPIDURAL BLOCK INJECTION Bilateral 04/29/2019   Procedure: LUMBAR MEDIAL BRANCH BLOCK  BILATERAL L3-4, L5/S1   #1;  Surgeon: Janus Von Adora Blanch, MD;  Location: [facility] OR;  Service: [facility];  Laterality: Bilateral;  . EPIDURAL BLOCK INJECTION Bilateral 05/13/2019   Procedure: LUMBAR MEDIAL BRANCH BLOCK L3-S1 BILATERAL   #2;  Surgeon: Janus Von Adora Blanch, MD;  Location: [facility];  Service: [facility];  Laterality: Bilateral;  . HERNIA REPAIR     Procedure: HERNIA REPAIR; x 2  . HYDROCELE EXCISION / REPAIR Left 03/11/2022   Procedure: HYDROCELECTOMY;  Surgeon: Constable Ranell Von, MD;  Location: [facility] MAIN OR;  Service: Urology;  Laterality: Left;  . KIDNEY TRANSPLANT N/A 05/19/2019   Procedure: KIDNEY TRANSPLANT CADAVERIC/ ON PUMP;  Surgeon: Dale Lonni Craw, MD;  Location: [facility] MAIN OR;  Service: Transplant;  Laterality: N/A;   UNOS# [id number]  Left donor kidney implanted in patient's right side.    Kidney in room: 0654  Kidney off pump: 0708  Kidney Off ice: 0938  Anastomosis Start: 0942  Repefusion: 1036    .  NEPHRECTOMY RADICAL Right 10/24/2021   Procedure: NEPHRECTOMY RADICAL ROBOTIC /  Right  . robotic radical nephrectomy / possible open. / native kidney;  Surgeon: Ranell Von Constable, MD;  Location: [facility] MAIN OR;  Service: Urology;  Laterality: Right;  . OTHER SURGICAL HISTORY Right    Procedure: OTHER SURGICAL HISTORY (kidney removal)  . URETERAL STENT PLACEMENT N/A 11/13/2022   CYSTOSCOPY WITH STENT INSERTION/EXCHANGE performed by Tanda Jama Nicholaus DOUGLAS, MD at [facility] OR  . URETERAL STENT PLACEMENT N/A 05/20/2023   CYSTOSCOPY WITH STENT INSERTION/EXCHANGE WITH OR WITHOUT RETROGRADE PYELOGRAM performed by Tanda Jama Nicholaus DOUGLAS, MD at [facility] OR  . URETERAL STENT PLACEMENT Right 09/16/2023   CYSTOSCOPY WITH TRANSPLANT URETERAL STENT EXCHANGE WITH OR WITHOUT RETROGRADE PYELOGRAM (RIGHT ILIAC FOSSA) performed by Tanda Jama Nicholaus DOUGLAS, MD at [facility] OR  [3] Medications Prior to Admission  Medication Sig Dispense Refill Last Dose/Taking  . albuterol  HFA (PROVENTIL  HFA;VENTOLIN  HFA;PROAIR  HFA) 90 mcg/actuation inhaler Inhale 2 puffs every 6 (six) hours as needed for wheezing or shortness of breath. 1 each 11 02/04/2024 Morning  . allopurinoL (ZYLOPRIM) 100 mg tablet Take 1 tablet (100 mg total) by mouth daily. 90 tablet 3 02/05/2024  . aspirin 81 mg  EC tablet Take 81 mg by mouth daily.   02/05/2024 Morning  . carvediloL (COREG) 25 mg tablet Take 1.5 tablets (37.5 mg total) by mouth in the morning and 1.5 tablets (37.5 mg total) in the evening. Take with meals. 270 tablet 3 02/05/2024 Morning  . colchicine 0.6 mg tablet Take 1 tablet (0.6 mg total) by mouth 2 (two) times a day as needed for muscle/joint pain. 30 tablet 0 02/05/2024  . famotidine (PEPCID) 20 mg tablet Take 1 tablet (20 mg total) by mouth daily. 90 tablet 3 02/05/2024 Morning  . hydrALAZINE (APRESOLINE) 100 mg tablet TAKE 1 TABLET THREE TIMES DAILY 270 tablet 3 02/05/2024 Noon  . magnesium oxide 400 mg (241 mg magnesium) tab Take 400 mg by mouth 2  (two) times a day.   02/05/2024 Noon  . mycophenolate (MYFORTIC) 180 mg TbEC DR tablet Take 2 tablets (360 mg total) by mouth 2 (two) times a day. 360 tablet 3 02/05/2024 Morning  . NIFEdipine (PROCARDIA XL) 90 mg 24 hr tablet Take 1 tablet (90 mg total) by mouth 2 (two) times a day. 180 tablet 1 02/05/2024 Noon  . polysaccharide iron complex (NU-IRON) 150 mg iron capsule TAKE 1 CAPSULE EVERY DAY 90 capsule 3 02/04/2024  . predniSONE (DELTASONE) 5 mg tablet TAKE 1 TABLET (5 MG TOTAL) BY MOUTH DAILY. 90 tablet 3 02/05/2024 Morning  . tacrolimus (PROGRAF) 1 mg capsule Take 1 capsule (1 mg total) by mouth every morning AND 1 capsule (1 mg total) every evening. Take with 5 mg for total dose of 6 mg twice a day. 180 capsule 3 02/05/2024 Morning  . tacrolimus (PROGRAF) 5 mg capsule Take 1 capsule (5 mg total) by mouth 2 (two) times a day. Take with 1 mg for total dose of 6 mg twice a day 180 capsule 3 02/05/2024 Morning  . terazosin (HYTRIN) 10 mg capsule Take 1 capsule (10 mg total) by mouth at bedtime. 90 capsule 3 02/04/2024 Evening  . torsemide (DEMADEX) 20 mg tablet Take one tablet (20 mg total) by mouth daily. MAY TAKE ADDITIONAL 20 MG AS NEEDED EVERY OTHER DAY FOR SWELLING 126 tablet 3 02/05/2024 Morning  . compr.stocking,thigh,reg,large misc 2 Packages by miscellaneous route daily. 1 each 0   . cyclobenzaprine (FLEXERIL) 10 mg tablet Take 1 tablet (10 mg total) by mouth 3 (three) times a day as needed for muscle spasms. 30 tablet 2 More than a month  . diclofenac sodium (VOLTAREN) 1 % gel Apply 2 g (to shoulders) and 4 g (to knee) up to three times a day as needed for pain 600 g 11   . polyethylene glycol (GLYCOLAX) 17 gram packet Take 17 g by mouth daily. 1530 g 4 More than a month  . sulfamethoxazole-trimethoprim (BACTRIM) 400-80 mg per tablet Take 1 tablet by mouth every Monday, Wednesday and Friday. 36 tablet 3 More than a month  . tamsulosin (FLOMAX) 0.4 mg cap Take 1 capsule (0.4 mg total) by  mouth daily. 90 capsule 3 More than a month  . valACYclovir (VALTREX) 500 mg tablet Take 1 tablet (500 mg total) by mouth daily. 90 tablet 4 More than a month  [4] Allergies Allergen Reactions  . Tetracycline GI Intolerance    headache  . Warfarin Itching    Takes benadryl with warfarin  [5] Family History Problem Relation Name Age of Onset  . Hypertension Mother    . Arthritis Mother    . Kidney failure Mother    . Alcohol abuse Father    .  Anesthesia problems Neg Hx

## 2024-02-08 ENCOUNTER — Emergency Department (HOSPITAL_BASED_OUTPATIENT_CLINIC_OR_DEPARTMENT_OTHER)

## 2024-02-08 ENCOUNTER — Other Ambulatory Visit: Payer: Self-pay

## 2024-02-08 ENCOUNTER — Encounter (HOSPITAL_BASED_OUTPATIENT_CLINIC_OR_DEPARTMENT_OTHER): Payer: Self-pay | Admitting: Emergency Medicine

## 2024-02-08 ENCOUNTER — Emergency Department (HOSPITAL_BASED_OUTPATIENT_CLINIC_OR_DEPARTMENT_OTHER)
Admission: EM | Admit: 2024-02-08 | Discharge: 2024-02-08 | Disposition: A | Discharge status: Other Acute Inpatient Hospital | Attending: Emergency Medicine | Admitting: Emergency Medicine

## 2024-02-08 DIAGNOSIS — W2201XA Walked into wall, initial encounter: Secondary | ICD-10-CM | POA: Insufficient documentation

## 2024-02-08 DIAGNOSIS — J9621 Acute and chronic respiratory failure with hypoxia: Secondary | ICD-10-CM | POA: Insufficient documentation

## 2024-02-08 DIAGNOSIS — M25511 Pain in right shoulder: Secondary | ICD-10-CM | POA: Diagnosis present

## 2024-02-08 DIAGNOSIS — Z940 Kidney transplant status: Secondary | ICD-10-CM | POA: Diagnosis not present

## 2024-02-08 DIAGNOSIS — M6281 Muscle weakness (generalized): Secondary | ICD-10-CM | POA: Diagnosis not present

## 2024-02-08 DIAGNOSIS — R10A0 Flank pain, unspecified side: Secondary | ICD-10-CM | POA: Diagnosis present

## 2024-02-08 DIAGNOSIS — I10 Essential (primary) hypertension: Secondary | ICD-10-CM | POA: Diagnosis not present

## 2024-02-08 DIAGNOSIS — J810 Acute pulmonary edema: Secondary | ICD-10-CM | POA: Diagnosis not present

## 2024-02-08 DIAGNOSIS — I700 Atherosclerosis of aorta: Secondary | ICD-10-CM | POA: Insufficient documentation

## 2024-02-08 DIAGNOSIS — Z79899 Other long term (current) drug therapy: Secondary | ICD-10-CM | POA: Insufficient documentation

## 2024-02-08 DIAGNOSIS — R0602 Shortness of breath: Secondary | ICD-10-CM | POA: Diagnosis present

## 2024-02-08 LAB — COMPREHENSIVE METABOLIC PANEL WITH GFR
ALT: 15 U/L (ref 0–44)
AST: 55 U/L — ABNORMAL HIGH (ref 15–41)
Albumin: 3.5 g/dL (ref 3.5–5.0)
Alkaline Phosphatase: 64 U/L (ref 38–126)
Anion gap: 12 (ref 5–15)
BUN: 48 mg/dL — ABNORMAL HIGH (ref 6–20)
CO2: 22 mmol/L (ref 22–32)
Calcium: 9 mg/dL (ref 8.9–10.3)
Chloride: 106 mmol/L (ref 98–111)
Creatinine, Ser: 3.43 mg/dL — ABNORMAL HIGH (ref 0.61–1.24)
GFR, Estimated: 20 mL/min — ABNORMAL LOW (ref >60)
Glucose, Bld: 152 mg/dL — ABNORMAL HIGH (ref 70–99)
Potassium: 4 mmol/L (ref 3.5–5.1)
Sodium: 140 mmol/L (ref 135–145)
Total Bilirubin: 0.4 mg/dL (ref 0.0–1.2)
Total Protein: 6.5 g/dL (ref 6.5–8.1)

## 2024-02-08 LAB — I-STAT VENOUS BLOOD GAS, ED
Acid-base deficit: 2 mmol/L (ref 0.0–2.0)
Bicarbonate: 23.3 mmol/L (ref 20.0–28.0)
Calcium, Ion: 1.23 mmol/L (ref 1.15–1.40)
HCT: 31 % — ABNORMAL LOW (ref 39.0–52.0)
Hemoglobin: 10.5 g/dL — ABNORMAL LOW (ref 13.0–17.0)
O2 Saturation: 96 %
Potassium: 4 mmol/L (ref 3.5–5.1)
Sodium: 141 mmol/L (ref 135–145)
TCO2: 25 mmol/L (ref 22–32)
pCO2, Ven: 41.6 mmHg — ABNORMAL LOW (ref 44–60)
pH, Ven: 7.355 (ref 7.25–7.43)
pO2, Ven: 84 mmHg — ABNORMAL HIGH (ref 32–45)

## 2024-02-08 LAB — URINALYSIS, W/ REFLEX TO CULTURE (INFECTION SUSPECTED)
Bilirubin Urine: NEGATIVE
Glucose, UA: NEGATIVE mg/dL
Hgb urine dipstick: NEGATIVE
Ketones, ur: NEGATIVE mg/dL
Nitrite: NEGATIVE
Protein, ur: 30 mg/dL — AB
Specific Gravity, Urine: 1.02 (ref 1.005–1.030)
pH: 5.5 (ref 5.0–8.0)

## 2024-02-08 LAB — CBC WITH DIFFERENTIAL/PLATELET
Abs Immature Granulocytes: 0.04 K/uL (ref 0.00–0.07)
Basophils Absolute: 0 K/uL (ref 0.0–0.1)
Basophils Relative: 0 %
Eosinophils Absolute: 0.1 K/uL (ref 0.0–0.5)
Eosinophils Relative: 1 %
HCT: 33.3 % — ABNORMAL LOW (ref 39.0–52.0)
Hemoglobin: 9.9 g/dL — ABNORMAL LOW (ref 13.0–17.0)
Immature Granulocytes: 0 %
Lymphocytes Relative: 7 %
Lymphs Abs: 0.7 K/uL (ref 0.7–4.0)
MCH: 23 pg — ABNORMAL LOW (ref 26.0–34.0)
MCHC: 29.7 g/dL — ABNORMAL LOW (ref 30.0–36.0)
MCV: 77.4 fL — ABNORMAL LOW (ref 80.0–100.0)
Monocytes Absolute: 1.1 K/uL — ABNORMAL HIGH (ref 0.1–1.0)
Monocytes Relative: 12 %
Neutro Abs: 7.3 K/uL (ref 1.7–7.7)
Neutrophils Relative %: 80 %
Platelets: 197 K/uL (ref 150–400)
RBC: 4.3 MIL/uL (ref 4.22–5.81)
RDW: 17.2 % — ABNORMAL HIGH (ref 11.5–15.5)
WBC: 9.2 K/uL (ref 4.0–10.5)
nRBC: 0 % (ref 0.0–0.2)

## 2024-02-08 LAB — URINE DRUG SCREEN
Amphetamines: NEGATIVE
Barbiturates: NEGATIVE
Benzodiazepines: NEGATIVE
Cocaine: NEGATIVE
Fentanyl: POSITIVE — AB
Methadone Scn, Ur: NEGATIVE
Opiates: NEGATIVE
Tetrahydrocannabinol: NEGATIVE

## 2024-02-08 LAB — RESP PANEL BY RT-PCR (RSV, FLU A&B, COVID)  RVPGX2
Influenza A by PCR: NEGATIVE
Influenza B by PCR: NEGATIVE
Resp Syncytial Virus by PCR: NEGATIVE
SARS Coronavirus 2 by RT PCR: NEGATIVE

## 2024-02-08 LAB — TROPONIN T, HIGH SENSITIVITY
Troponin T High Sensitivity: 86 ng/L — ABNORMAL HIGH (ref 0–19)
Troponin T High Sensitivity: 79 ng/L — ABNORMAL HIGH (ref 0–19)

## 2024-02-08 LAB — ETHANOL: Alcohol, Ethyl (B): <15 mg/dL (ref <15)

## 2024-02-08 LAB — LACTIC ACID, PLASMA: Lactic Acid, Venous: 0.6 mmol/L (ref 0.5–1.9)

## 2024-02-08 LAB — LIPASE, BLOOD: Lipase: 14 U/L (ref 11–51)

## 2024-02-08 LAB — PRO BRAIN NATRIURETIC PEPTIDE: Pro Brain Natriuretic Peptide: 6398 pg/mL — ABNORMAL HIGH (ref <300.0)

## 2024-02-08 MED ORDER — FUROSEMIDE 10 MG/ML IJ SOLN
40.0000 mg | Freq: Once | INTRAMUSCULAR | Status: AC
  Administered 2024-02-08: 40 mg via INTRAVENOUS
  Filled 2024-02-08: qty 4

## 2024-02-08 NOTE — ED Notes (Signed)
 Pt will go with transport to [facility]. Figured out what to do with his vehicle, daughter to pick up and take it home.

## 2024-02-08 NOTE — ED Notes (Signed)
 XR at bedside

## 2024-02-08 NOTE — ED Notes (Signed)
 Accepting [facility].

## 2024-02-08 NOTE — ED Notes (Signed)
 Images pushed to [facility] and demographics faxed.

## 2024-02-08 NOTE — ED Provider Notes (Signed)
.  Ultrasound ED Peripheral IV (Provider)  Date/Time: 02/08/2024 1:08 PM  Performed by: Myriam Dorn BROCKS, PA Authorized by: Myriam Dorn BROCKS, PA   Procedure details:    Indications: multiple failed IV attempts and poor IV access     Skin Prep: chlorhexidine gluconate     Location:  Left AC   Angiocath:  20 G   Bedside Ultrasound Guided: Yes     Images: not archived     Patient tolerated procedure without complications: Yes     Dressing applied: Yes       Myriam Dorn BROCKS, PA 02/08/24 1309    Darra Fonda MATSU, MD 02/08/24 [phone]

## 2024-02-08 NOTE — ED Notes (Signed)
 Pt handed over his keys to security just now. Placed into a clear plastic bag with labeled with his sticker. His daughter is going to come pick them up tonight. Nothing else was collected by security at this time.

## 2024-02-08 NOTE — ED Notes (Signed)
 ED Provider at bedside.

## 2024-02-08 NOTE — ED Notes (Signed)
 GCS 14, pt came in for evaluation but nods off and snores immediately without verbal stimulation. Came in after kidney stent placement 3 days prior, denies fever.

## 2024-02-08 NOTE — ED Triage Notes (Signed)
 Pt was seen at [facility] earlier for RT shoulder pain (walked into a wall Fri); was hypoxic and hypotensive there; O2 sat 84% RA in triage; moved to RM 5 to complete triage

## 2024-02-08 NOTE — ED Notes (Signed)
 Having the PA start an USIV.

## 2024-02-08 NOTE — ED Notes (Signed)
 Pt advised he wants to refuse transport and go POV. Triffan explained that is unwise and AMA if he decides to sign out.

## 2024-02-08 NOTE — ED Provider Notes (Signed)
 Emergency Department Provider Note   I have reviewed the triage vital signs and the nursing notes.   HISTORY  Chief Complaint Shoulder Pain and Shortness of Breath   HPI Roger Coleman is a 58 y.o. male with past history of hypertension, renal transplant with stent exchange 3 days prior in the [facility] presents to the emergency department with right shoulder pain from urgent care.  He states 2 days ago he ran into the wall while walking through his house injuring his shoulder.  He states after that he developed vomiting and had some difficulty keeping down his medications.  He has since realized he can keep them down but has felt increasingly weak.  He went to urgent care and was found to be hypoxemic and hypotensive.  He was subsequently referred to the emergency department.  He denies any abdominal or back pain.  No chest discomfort.  No fevers or chills.  He continues to make urine.  He is not currently on hemodialysis.   Past Medical History:  Diagnosis Date   Dialysis patient    Hypertension     Review of Systems  Constitutional: No fever/chills. Positive weakness.  Cardiovascular: Denies chest pain. Respiratory: Denies shortness of breath. Positive hypoxemia.  Gastrointestinal: No abdominal pain.  Positive nausea and vomiting.  No diarrhea.  No constipation. Skin: Negative for rash. Neurological: Negative for headaches, focal weakness or numbness.  ____________________________________________   PHYSICAL EXAM:  VITAL SIGNS: ED Triage Vitals  Encounter Vitals Group     BP 02/08/24 1230 118/66     Pulse Rate 02/08/24 1230 82     Resp 02/08/24 1230 (!) 24     Temp 02/08/24 1230 98.5 F (36.9 C)     Temp Source 02/08/24 1230 Oral     SpO2 02/08/24 1230 (!) 84 %     Weight 02/08/24 1240 (!) 307 lb (139.3 kg)     Height 02/08/24 1240 5' 11 (1.803 m)   Constitutional: Alert and oriented. Patient gives a full history but does seem drowsy when you are not actively  speaking with him.  Eyes: Conjunctivae are normal.  Head: Atraumatic. Nose: No congestion/rhinnorhea. Mouth/Throat: Mucous membranes are moist.   Neck: No stridor.  Cardiovascular: Normal rate, regular rhythm. Good peripheral circulation. Grossly normal heart sounds.   Respiratory: Normal respiratory effort.  No retractions. Lungs CTAB. Gastrointestinal: Soft and nontender. Mild distention.  Musculoskeletal: No lower extremity tenderness nor edema. No gross deformities of extremities. Neurologic:  Normal speech and language. No gross focal neurologic deficits are appreciated.  Skin:  Skin is warm, dry and intact. No rash noted.  ____________________________________________   LABS (all labs ordered are listed, but only abnormal results are displayed)  Labs Reviewed  CBC WITH DIFFERENTIAL/PLATELET - Abnormal; Notable for the following components:      Result Value   Hemoglobin 9.9 (*)    HCT 33.3 (*)    MCV 77.4 (*)    MCH 23.0 (*)    MCHC 29.7 (*)    RDW 17.2 (*)    Monocytes Absolute 1.1 (*)    All other components within normal limits  COMPREHENSIVE METABOLIC PANEL WITH GFR - Abnormal; Notable for the following components:   Glucose, Bld 152 (*)    BUN 48 (*)    Creatinine, Ser 3.43 (*)    AST 55 (*)    GFR, Estimated 20 (*)    All other components within normal limits  PRO BRAIN NATRIURETIC PEPTIDE - Abnormal; Notable for  the following components:   Pro Brain Natriuretic Peptide 6,398.0 (*)    All other components within normal limits  I-STAT VENOUS BLOOD GAS, ED - Abnormal; Notable for the following components:   pCO2, Ven 41.6 (*)    pO2, Ven 84 (*)    HCT 31.0 (*)    Hemoglobin 10.5 (*)    All other components within normal limits  TROPONIN T, HIGH SENSITIVITY - Abnormal; Notable for the following components:   Troponin T High Sensitivity 86 (*)    All other components within normal limits  CULTURE, BLOOD (ROUTINE X 2)  CULTURE, BLOOD (ROUTINE X 2)  URINE  CULTURE  RESP PANEL BY RT-PCR (RSV, FLU A&B, COVID)  RVPGX2  LACTIC ACID, PLASMA  LIPASE, BLOOD  ETHANOL  URINALYSIS, W/ REFLEX TO CULTURE (INFECTION SUSPECTED)  URINE DRUG SCREEN  TSH  TROPONIN T, HIGH SENSITIVITY   ____________________________________________  EKG   EKG Interpretation Date/Time:  Sunday February 08 2024 12:55:20 EDT Ventricular Rate:  81 PR Interval:  135 QRS Duration:  85 QT Interval:  381 QTC Calculation: 443 R Axis:   16  Text Interpretation: Sinus rhythm Anteroseptal infarct, old Confirmed by Darra Chew [phone]) on 02/08/2024 12:57:03 PM        ____________________________________________  RADIOLOGY  DG Chest Portable 1 View Result Date: 02/08/2024 CLINICAL DATA:  Right shoulder pain and hypoxia. EXAM: PORTABLE CHEST 1 VIEW COMPARISON:  June 24, 2023 FINDINGS: The heart size and mediastinal contours are within normal limits. There is moderate severity calcification of the aortic arch. There is prominence of the pulmonary vasculature with bilateral diffusely increased interstitial lung markings. No focal consolidation, pleural effusion or pneumothorax is identified. The visualized skeletal structures are unremarkable. IMPRESSION: Mild pulmonary vascular congestion with mild to moderate severity interstitial edema. Electronically Signed   By: Suzen Dials M.D.   On: 02/08/2024 13:46   DG Shoulder Right Result Date: 02/08/2024 CLINICAL DATA:  Status post trauma. EXAM: RIGHT SHOULDER - 2+ VIEW COMPARISON:  None Available. FINDINGS: There is no evidence of fracture or dislocation. There is no evidence of arthropathy or other focal bone abnormality. Soft tissues are unremarkable. IMPRESSION: Negative. Electronically Signed   By: Suzen Dials M.D.   On: 02/08/2024 13:44    ____________________________________________   PROCEDURES  Procedure(s) performed:   Procedures  CRITICAL CARE Performed by: Chew KANDICE Darra Total critical care time: 75  minutes Critical care time was exclusive of separately billable procedures and treating other patients. Critical care was necessary to treat or prevent imminent or life-threatening deterioration. Critical care was time spent personally by me on the following activities: development of treatment plan with patient and/or surrogate as well as nursing, discussions with consultants, evaluation of patient's response to treatment, examination of patient, obtaining history from patient or surrogate, ordering and performing treatments and interventions, ordering and review of laboratory studies, ordering and review of radiographic studies, pulse oximetry and re-evaluation of patient's condition.  Chew Darra, MD Emergency Medicine  ____________________________________________   INITIAL IMPRESSION / ASSESSMENT AND PLAN / ED COURSE  Pertinent labs & imaging results that were available during my care of the patient were reviewed by me and considered in my medical decision making (see chart for details).   This patient is Presenting for Evaluation of AMS, which does require a range of treatment options, and is a complaint that involves a high risk of morbidity and mortality.  The Differential Diagnoses includes but is not exclusive to alcohol, illicit or prescription medications,  intracranial pathology such as stroke, intracerebral hemorrhage, fever or infectious causes including sepsis, hypoxemia, uremia, trauma, endocrine related disorders such as diabetes, hypoglycemia, thyroid-related diseases, etc.   Critical Interventions-    Medications  furosemide  (LASIX ) injection 40 mg (has no administration in time range)    Reassessment after intervention: mental status not worsening.   I decided to review pertinent External Data, and in summary patient followed in the [facility] with stent exchange 3 days prior.   Clinical Laboratory Tests Ordered, included proBNP greater than 6000.  CBC without  leukocytosis.  Creatinine of 3.43 elevated compared to baseline of 2.4.  Lactic acid normal.  VBG shows pH of 7.35 and low CO2 at 41.6.  Radiologic Tests Ordered, included CXR and shoulder XR. I independently interpreted the images and agree with radiology interpretation.   Cardiac Monitor Tracing which shows NSR.    Social Determinants of Health Risk patient is a non-smoker.   Consult complete with Dr. Delsie at [facility].  He will accept the patient in transfer to their facility.   Medical Decision Making: Summary:  Patient presents emergency department with right shoulder pain after injury at home but arrives from urgent care with new oxygen requirement and hypotension at that facility.  Blood pressure on my evaluation is 119/68.  He is awake and giving a history but is drowsy with some nodding off if you are not immediately engaging with him.  Afebrile.  Denies abdominal pain.  Given his complex medical history plan for fairly broad workup including sepsis evaluation.   Reevaluation with update and discussion with patient.  He remains awake and alert.  He is resting.  Plan for transfer to [facility].   Patient's presentation is most consistent with acute presentation with potential threat to life or bodily function.   Disposition: Transfer to [facility]  ____________________________________________  FINAL CLINICAL IMPRESSION(S) / ED DIAGNOSES  Final diagnoses:  Acute on chronic respiratory failure with hypoxia (HCC)  Acute pulmonary edema (HCC)    Note:  This document was prepared using Dragon voice recognition software and may include unintentional dictation errors.  Fonda Law, MD, [facility] Emergency Medicine    Chriss Mannan, Fonda MATSU, MD 02/08/24 [phone]

## 2024-02-08 NOTE — ED Notes (Signed)
 Pt provided bedside urinal for UA.

## 2024-02-08 NOTE — ED Notes (Signed)
 Pt placed on 3L O2, sats up to 90%

## 2024-02-09 LAB — TSH: TSH: 0.953 u[IU]/mL (ref 0.350–4.500)

## 2024-02-10 LAB — URINE CULTURE: Culture: NO GROWTH

## 2024-02-13 LAB — CULTURE, BLOOD (ROUTINE X 2)
Culture: NO GROWTH
Culture: NO GROWTH
Special Requests: ADEQUATE
Special Requests: ADEQUATE
